# Patient Record
Sex: Female | Born: 1945 | Race: White | Hispanic: No | Smoking: Never smoker
Health system: Southern US, Community
[De-identification: ages and names within clinical notes are randomized; demographics above are authoritative.]

## PROBLEM LIST (undated history)

## (undated) DIAGNOSIS — E785 Hyperlipidemia, unspecified: Secondary | ICD-10-CM

## (undated) DIAGNOSIS — M8580 Other specified disorders of bone density and structure, unspecified site: Secondary | ICD-10-CM

## (undated) HISTORY — DX: Other specified disorders of bone density and structure, unspecified site: M85.80

## (undated) HISTORY — PX: BREAST EXCISIONAL BIOPSY: SUR124

## (undated) HISTORY — DX: Hyperlipidemia, unspecified: E78.5

## (undated) HISTORY — PX: BREAST SURGERY: SHX581

## (undated) HISTORY — PX: COLONOSCOPY: SHX174

## (undated) HISTORY — PX: FINGER SURGERY: SHX640

---

## 2008-05-12 ENCOUNTER — Encounter: Admission: RE | Admit: 2008-05-12 | Discharge: 2008-05-12 | Payer: Self-pay | Admitting: Internal Medicine

## 2009-06-05 ENCOUNTER — Encounter: Admission: RE | Admit: 2009-06-05 | Discharge: 2009-06-05 | Payer: Self-pay | Admitting: Internal Medicine

## 2009-07-11 ENCOUNTER — Encounter (INDEPENDENT_AMBULATORY_CARE_PROVIDER_SITE_OTHER): Payer: Self-pay | Admitting: Cardiology

## 2009-07-11 ENCOUNTER — Ambulatory Visit (HOSPITAL_COMMUNITY): Admission: RE | Admit: 2009-07-11 | Discharge: 2009-07-11 | Payer: Self-pay | Admitting: Cardiology

## 2010-06-05 ENCOUNTER — Other Ambulatory Visit: Payer: Self-pay | Admitting: Internal Medicine

## 2010-06-05 DIAGNOSIS — Z1231 Encounter for screening mammogram for malignant neoplasm of breast: Secondary | ICD-10-CM

## 2010-06-12 ENCOUNTER — Ambulatory Visit
Admission: RE | Admit: 2010-06-12 | Discharge: 2010-06-12 | Disposition: A | Discharge status: Home / Self Care | Payer: PRIVATE HEALTH INSURANCE | Source: Ambulatory Visit | Attending: Internal Medicine | Admitting: Internal Medicine

## 2010-06-12 DIAGNOSIS — Z1231 Encounter for screening mammogram for malignant neoplasm of breast: Secondary | ICD-10-CM

## 2011-08-26 ENCOUNTER — Other Ambulatory Visit: Payer: Self-pay | Admitting: Internal Medicine

## 2011-08-26 DIAGNOSIS — Z1231 Encounter for screening mammogram for malignant neoplasm of breast: Secondary | ICD-10-CM

## 2011-09-17 ENCOUNTER — Ambulatory Visit
Admission: RE | Admit: 2011-09-17 | Discharge: 2011-09-17 | Disposition: A | Discharge status: Home / Self Care | Payer: PRIVATE HEALTH INSURANCE | Source: Ambulatory Visit | Attending: Internal Medicine | Admitting: Internal Medicine

## 2011-09-17 DIAGNOSIS — Z1231 Encounter for screening mammogram for malignant neoplasm of breast: Secondary | ICD-10-CM

## 2012-10-05 ENCOUNTER — Other Ambulatory Visit: Payer: Self-pay

## 2012-10-05 DIAGNOSIS — Z1231 Encounter for screening mammogram for malignant neoplasm of breast: Secondary | ICD-10-CM

## 2012-11-10 ENCOUNTER — Ambulatory Visit
Admission: RE | Admit: 2012-11-10 | Discharge: 2012-11-10 | Disposition: A | Discharge status: Home / Self Care | Payer: PRIVATE HEALTH INSURANCE | Source: Ambulatory Visit

## 2012-11-10 DIAGNOSIS — Z1231 Encounter for screening mammogram for malignant neoplasm of breast: Secondary | ICD-10-CM

## 2013-11-20 ENCOUNTER — Inpatient Hospital Stay (HOSPITAL_COMMUNITY)
Admission: EM | Admit: 2013-11-20 | Discharge: 2013-11-23 | DRG: 536 | Disposition: A | Discharge status: Skilled Nursing Facility | Payer: Medicare Other | Attending: Orthopaedic Surgery | Admitting: Orthopaedic Surgery

## 2013-11-20 ENCOUNTER — Emergency Department (HOSPITAL_COMMUNITY): Payer: Medicare Other

## 2013-11-20 ENCOUNTER — Encounter (HOSPITAL_COMMUNITY): Payer: Self-pay | Admitting: Emergency Medicine

## 2013-11-20 DIAGNOSIS — S32301A Unspecified fracture of right ilium, initial encounter for closed fracture: Secondary | ICD-10-CM

## 2013-11-20 DIAGNOSIS — W64XXXA Exposure to other animate mechanical forces, initial encounter: Secondary | ICD-10-CM | POA: Diagnosis present

## 2013-11-20 DIAGNOSIS — M25559 Pain in unspecified hip: Secondary | ICD-10-CM | POA: Diagnosis present

## 2013-11-20 DIAGNOSIS — S32309A Unspecified fracture of unspecified ilium, initial encounter for closed fracture: Secondary | ICD-10-CM | POA: Diagnosis present

## 2013-11-20 LAB — CBC WITH DIFFERENTIAL/PLATELET
BASOS PCT: 0 % (ref 0–1)
Basophils Absolute: 0 10*3/uL (ref 0.0–0.1)
EOS PCT: 0 % (ref 0–5)
Eosinophils Absolute: 0 10*3/uL (ref 0.0–0.7)
HEMATOCRIT: 38.5 % (ref 36.0–46.0)
Hemoglobin: 13 g/dL (ref 12.0–15.0)
LYMPHS PCT: 8 % — AB (ref 12–46)
Lymphs Abs: 1.2 10*3/uL (ref 0.7–4.0)
MCH: 30.8 pg (ref 26.0–34.0)
MCHC: 33.8 g/dL (ref 30.0–36.0)
MCV: 91.2 fL (ref 78.0–100.0)
MONO ABS: 1 10*3/uL (ref 0.1–1.0)
MONOS PCT: 7 % (ref 3–12)
Neutro Abs: 12.6 10*3/uL — ABNORMAL HIGH (ref 1.7–7.7)
Neutrophils Relative %: 85 % — ABNORMAL HIGH (ref 43–77)
Platelets: 305 10*3/uL (ref 150–400)
RBC: 4.22 MIL/uL (ref 3.87–5.11)
RDW: 13.5 % (ref 11.5–15.5)
WBC: 14.9 10*3/uL — ABNORMAL HIGH (ref 4.0–10.5)

## 2013-11-20 LAB — ABO/RH: ABO/RH(D): A NEG

## 2013-11-20 LAB — BASIC METABOLIC PANEL
Anion gap: 16 — ABNORMAL HIGH (ref 5–15)
BUN: 21 mg/dL (ref 6–23)
CO2: 23 mEq/L (ref 19–32)
Calcium: 9.3 mg/dL (ref 8.4–10.5)
Chloride: 101 mEq/L (ref 96–112)
Creatinine, Ser: 0.77 mg/dL (ref 0.50–1.10)
GFR calc Af Amer: >90 mL/min (ref >90)
GFR calc non Af Amer: 86 mL/min — ABNORMAL LOW (ref >90)
Glucose, Bld: 141 mg/dL — ABNORMAL HIGH (ref 70–99)
Potassium: 3.5 mEq/L — ABNORMAL LOW (ref 3.7–5.3)
Sodium: 140 mEq/L (ref 137–147)

## 2013-11-20 LAB — TYPE AND SCREEN
ABO/RH(D): A NEG
Antibody Screen: NEGATIVE

## 2013-11-20 LAB — PROTIME-INR
INR: 0.96 (ref 0.00–1.49)
Prothrombin Time: 12.8 seconds (ref 11.6–15.2)

## 2013-11-20 MED ORDER — ADULT MULTIVITAMIN W/MINERALS CH
1.0000 | ORAL_TABLET | Freq: Every day | ORAL | Status: DC
  Administered 2013-11-21 – 2013-11-23 (×3): 1 via ORAL
  Filled 2013-11-20 (×3): qty 1

## 2013-11-20 MED ORDER — ONDANSETRON HCL 4 MG PO TABS: 4.0000 mg | ORAL_TABLET | Freq: Four times a day (QID) | ORAL | Status: DC | PRN

## 2013-11-20 MED ORDER — ONDANSETRON HCL 4 MG/2ML IJ SOLN: 4.0000 mg | Freq: Three times a day (TID) | INTRAMUSCULAR | Status: AC | PRN

## 2013-11-20 MED ORDER — DIPHENHYDRAMINE HCL 12.5 MG/5ML PO ELIX: 12.5000 mg | ORAL_SOLUTION | ORAL | Status: DC | PRN

## 2013-11-20 MED ORDER — METOCLOPRAMIDE HCL 5 MG/ML IJ SOLN: 5.0000 mg | Freq: Three times a day (TID) | INTRAMUSCULAR | Status: DC | PRN

## 2013-11-20 MED ORDER — ONDANSETRON HCL 4 MG/2ML IJ SOLN
4.0000 mg | Freq: Four times a day (QID) | INTRAMUSCULAR | Status: DC | PRN
  Administered 2013-11-22: 4 mg via INTRAVENOUS
  Filled 2013-11-20: qty 2

## 2013-11-20 MED ORDER — METOCLOPRAMIDE HCL 10 MG PO TABS: 5.0000 mg | ORAL_TABLET | Freq: Three times a day (TID) | ORAL | Status: DC | PRN

## 2013-11-20 MED ORDER — OXYCODONE HCL 5 MG PO TABS
5.0000 mg | ORAL_TABLET | ORAL | Status: DC | PRN
  Administered 2013-11-21: 5 mg via ORAL
  Filled 2013-11-20: qty 1

## 2013-11-20 MED ORDER — FENTANYL CITRATE 0.05 MG/ML IJ SOLN
50.0000 ug | Freq: Once | INTRAMUSCULAR | Status: AC
  Administered 2013-11-20: 50 ug via INTRAVENOUS
  Filled 2013-11-20: qty 2

## 2013-11-20 MED ORDER — IOHEXOL 300 MG/ML  SOLN
100.0000 mL | Freq: Once | INTRAMUSCULAR | Status: AC | PRN
  Administered 2013-11-20: 100 mL via INTRAVENOUS

## 2013-11-20 MED ORDER — MORPHINE SULFATE 2 MG/ML IJ SOLN: 1.0000 mg | INTRAMUSCULAR | Status: DC | PRN

## 2013-11-20 MED ORDER — DOCUSATE SODIUM 100 MG PO CAPS
100.0000 mg | ORAL_CAPSULE | Freq: Two times a day (BID) | ORAL | Status: DC
  Administered 2013-11-20 – 2013-11-23 (×6): 100 mg via ORAL

## 2013-11-20 MED ORDER — HYDROCODONE-ACETAMINOPHEN 5-325 MG PO TABS
1.0000 | ORAL_TABLET | ORAL | Status: DC | PRN
  Administered 2013-11-20 – 2013-11-21 (×3): 1 via ORAL
  Administered 2013-11-21 – 2013-11-22 (×2): 2 via ORAL
  Administered 2013-11-22 – 2013-11-23 (×2): 1 via ORAL
  Filled 2013-11-20 (×2): qty 1
  Filled 2013-11-20: qty 2
  Filled 2013-11-20 (×2): qty 1
  Filled 2013-11-20: qty 2
  Filled 2013-11-20: qty 1

## 2013-11-20 MED ORDER — FENTANYL CITRATE 0.05 MG/ML IJ SOLN
100.0000 ug | Freq: Once | INTRAMUSCULAR | Status: AC
  Administered 2013-11-20: 100 ug via INTRAVENOUS
  Filled 2013-11-20: qty 2

## 2013-11-20 MED ORDER — ONDANSETRON HCL 4 MG/2ML IJ SOLN
4.0000 mg | Freq: Once | INTRAMUSCULAR | Status: AC
  Administered 2013-11-20: 4 mg via INTRAVENOUS
  Filled 2013-11-20: qty 2

## 2013-11-20 MED ORDER — SODIUM CHLORIDE 0.9 % IV SOLN: INTRAVENOUS | Status: DC

## 2013-11-20 MED ORDER — CALCIUM CARBONATE-VITAMIN D 500-200 MG-UNIT PO TABS
1.0000 | ORAL_TABLET | Freq: Every day | ORAL | Status: DC
  Administered 2013-11-21 – 2013-11-23 (×3): 1 via ORAL
  Filled 2013-11-20 (×4): qty 1

## 2013-11-20 MED ORDER — METHOCARBAMOL 500 MG PO TABS
500.0000 mg | ORAL_TABLET | Freq: Four times a day (QID) | ORAL | Status: DC | PRN
Start: 2013-11-20 — End: 2013-11-23
  Administered 2013-11-20 – 2013-11-23 (×3): 500 mg via ORAL
  Filled 2013-11-20 (×3): qty 1

## 2013-11-20 MED ORDER — MORPHINE SULFATE 4 MG/ML IJ SOLN: 4.0000 mg | Freq: Once | INTRAMUSCULAR | Status: DC

## 2013-11-20 MED ORDER — LUTEIN 10 MG PO TABS: 1.0000 | ORAL_TABLET | Freq: Every day | ORAL | Status: DC

## 2013-11-20 MED ORDER — DEXTROSE 5 % IV SOLN
500.0000 mg | Freq: Four times a day (QID) | INTRAVENOUS | Status: DC | PRN
  Filled 2013-11-20: qty 5

## 2013-11-20 NOTE — ED Notes (Signed)
Pt cleared off backboard, pt had no soreness or pain to palpation of spine and neck

## 2013-11-20 NOTE — ED Notes (Signed)
Bed: WA08 Expected date:  Expected time:  Means of arrival:  Comments: Fall hip pain

## 2013-11-20 NOTE — H&P (Signed)
[name] French is an 68 y.o. female.   Chief Complaint:   Right hip/pelvic pain; known iliac wing fracture HPI:   Very pleasant 68 yo female who was kicked in her right hip/pelvic area this afternoon by a horse.  Was brought to the [facility] ED and found to have a right pelvic iliac wing fracture.  Ortho was consulted for further evaluation and treatment.  She reports right-sided pelviv pain and denies other injuries.  History reviewed. No pertinent past medical history.  Past Surgical History  Procedure Laterality Date  . Finger surgery Right     ring  . Breast surgery Left     Ca++ deposits removed    No family history on file. Social History:  reports that she has never smoked. She does not have any smokeless tobacco history on file. She reports that she drinks alcohol. She reports that she does not use illicit drugs.  Allergies: No Known Allergies   (Not in a hospital admission)  Results for orders placed during the hospital encounter of 11/20/13 (from the past 48 hour(s))  BASIC METABOLIC PANEL     Status: Abnormal   Collection Time    11/20/13  3:30 PM      Result Value Ref Range   Sodium 140  137 - 147 mEq/L   Potassium 3.5 (*) 3.7 - 5.3 mEq/L   Chloride 101  96 - 112 mEq/L   CO2 23  19 - 32 mEq/L   Glucose, Bld 141 (*) 70 - 99 mg/dL   BUN 21  6 - 23 mg/dL   Creatinine, Ser 0.77  0.50 - 1.10 mg/dL   Calcium 9.3  8.4 - 10.5 mg/dL   GFR calc non Af Amer 86 (*) >90 mL/min   GFR calc Af Amer >90  >90 mL/min   Comment: (NOTE)     The eGFR has been calculated using the CKD EPI equation.     This calculation has not been validated in all clinical situations.     eGFR's persistently <90 mL/min signify possible Chronic Kidney     Disease.   Anion gap 16 (*) 5 - 15  CBC WITH DIFFERENTIAL     Status: Abnormal   Collection Time    11/20/13  3:30 PM      Result Value Ref Range   WBC 14.9 (*) 4.0 - 10.5 K/uL   RBC 4.22  3.87 - 5.11 MIL/uL   Hemoglobin 13.0  12.0 - 15.0  g/dL   HCT 38.5  36.0 - 46.0 %   MCV 91.2  78.0 - 100.0 fL   MCH 30.8  26.0 - 34.0 pg   MCHC 33.8  30.0 - 36.0 g/dL   RDW 13.5  11.5 - 15.5 %   Platelets 305  150 - 400 K/uL   Neutrophils Relative % 85 (*) 43 - 77 %   Neutro Abs 12.6 (*) 1.7 - 7.7 K/uL   Lymphocytes Relative 8 (*) 12 - 46 %   Lymphs Abs 1.2  0.7 - 4.0 K/uL   Monocytes Relative 7  3 - 12 %   Monocytes Absolute 1.0  0.1 - 1.0 K/uL   Eosinophils Relative 0  0 - 5 %   Eosinophils Absolute 0.0  0.0 - 0.7 K/uL   Basophils Relative 0  0 - 1 %   Basophils Absolute 0.0  0.0 - 0.1 K/uL  PROTIME-INR     Status: None   Collection Time    11/20/13  3:30  PM      Result Value Ref Range   Prothrombin Time 12.8  11.6 - 15.2 seconds   INR 0.96  0.00 - 1.49  TYPE AND SCREEN     Status: None   Collection Time    11/20/13  4:07 PM      Result Value Ref Range   ABO/RH(D) A NEG     Antibody Screen NEG     Sample Expiration 11/23/2013     Dg Hip Complete Right  11/20/2013   CLINICAL DATA:  Right hip pain, kicked by horse  EXAM: RIGHT HIP - COMPLETE 2+ VIEW  COMPARISON:  None.  FINDINGS: Acute displaced fracture through the right iliac wing. There is extensive surrounding hematoma and soft tissue swelling involving the gluteal musculature and overlying adipose tissues. The right sacroiliac joint appears intact. The femoral head remains located.  IMPRESSION: Acute displaced fracture through the right iliac wing extending from the anterior superior iliac spine through the iliac crest.  There is surrounding soft tissue swelling and hematoma.   Electronically Signed   By: Jacqulynn Cadet M.D.   On: 11/20/2013 14:35   Ct Abdomen Pelvis W Contrast  11/20/2013   CLINICAL DATA:  trauma protocol  EXAM: CT ABDOMEN AND PELVIS WITH CONTRAST  TECHNIQUE: Multidetector CT imaging of the abdomen and pelvis was performed using the standard protocol following bolus administration of intravenous contrast.  CONTRAST:  125m OMNIPAQUE IOHEXOL 300 MG/ML  SOLN   COMPARISON:  None.  FINDINGS: Minimal dependent atelectasis in the visualized lung bases. Unremarkable liver, nondistended gallbladder, spleen, adrenal glands, pancreas. Left parapelvic renal cyst. Renal parenchyma otherwise unremarkable bilaterally. No hydronephrosis. Minimal infrarenal aortic plaque. IVC and portal vein patent. Stomach, small bowel, and colon are nondilated. Urinary bladder is incompletely distended. Bilateral pelvic phleboliths.  There is a displaced fracture of the right iliac wing, major fracture fragments displaced approximately 15 mm. This does not extend to involve the acetabulum or SI joint. Hips are intact. There is a right pelvic extraperitoneal hematoma adjacent to the iliacus and right gluteal musculature. No other acute bone abnormality. Mild spondylitic changes in the lower lumbar spine.  IMPRESSION: 1. Displaced right iliac wing fracture with adjacent  hematoma. 2. No acute intra-abdominal process.   Electronically Signed   By: DArne ClevelandM.D.   On: 11/20/2013 17:15    Review of Systems  All other systems reviewed and are negative.   Blood pressure 146/59, pulse 79, temperature 97.8 F (36.6 C), temperature source Oral, resp. rate 16, SpO2 100.00%. Physical Exam  Constitutional: She is oriented to person, place, and time. She appears well-developed and well-nourished.  HENT:  Head: Normocephalic and atraumatic.  Eyes: EOM are normal. Pupils are equal, round, and reactive to light.  Neck: Normal range of motion. Neck supple.  Cardiovascular: Regular rhythm.   Respiratory: Effort normal and breath sounds normal.  GI: Soft. Bowel sounds are normal.  Musculoskeletal:       Right hip: She exhibits decreased range of motion, decreased strength and bony tenderness.       Legs: Neurological: She is alert and oriented to person, place, and time.  Skin: Skin is warm and dry.  Psychiatric: She has a normal mood and affect.     Assessment/Plan Right comminuted  pelvic iliac wing fracture 1)  I spoke with her in great length and she understands, given her pain and the effects of this fracture on her mobility, that she needs admission to the hospital for pain  control and therapy.  I will allow her to eat and she can be full WBAT on her right hip.  She will need PT and OT given the fact that she lives alone.  Olajuwon Fosdick Y 11/20/2013, 7:03 PM

## 2013-11-20 NOTE — ED Provider Notes (Signed)
Medical screening examination/treatment/procedure(s) were performed by non-physician practitioner and as supervising physician I was immediately available for consultation/collaboration.   EKG Interpretation None        Earnie Rockhold L Kolleen Ochsner, MD 11/20/13 2223 

## 2013-11-20 NOTE — ED Notes (Signed)
Called to give nurse report. Nurse unavailable

## 2013-11-20 NOTE — ED Provider Notes (Signed)
CSN: [id number]     Arrival date & time 11/20/13  1327 History   First MD Initiated Contact with Patient 11/20/13 1511     Chief Complaint  Patient presents with  . Hip Pain  . Fall     (Consider location/radiation/quality/duration/timing/severity/associated sxs/prior Treatment) HPI  Patient to the ER by EMS after being kicked by a large horse. The horse had meant to kick the horse she was walking next to but that horse moved out of the way. She immediately fell to the ground and has been unable to walk since then. She has no head or neck pain. No chest pain or abdominal pain. Denies loc or hitting her head. Her pain is localized to her right hip. Denies bowel or urine incontinence. Denies being unable to move her toes. She was given 100 mcg of Fentanyl by EMS and then another 50 mcg here in the ED. She denies needing anymore pain medication at this time and says that she as little to no pain when sitting still. Brought in on LSB and in c-collar.  History reviewed. No pertinent past medical history. Past Surgical History  Procedure Laterality Date  . Finger surgery Right     ring  . Breast surgery Left     Ca++ deposits removed   No family history on file. History  Substance Use Topics  . Smoking status: Never Smoker   . Smokeless tobacco: Not on file  . Alcohol Use: Yes     Comment: social   OB History   Grav Para Term Preterm Abortions TAB SAB Ect Mult Living                 Review of Systems   Review of Systems  Gen: no weight loss, fevers, chills, night sweats  Eyes: no occular draining, occular pain,  No visual changes  Nose: no epistaxis or rhinorrhea  Mouth: no dental pain, no sore throat  Neck: no neck pain  Lungs: No hemoptysis. No wheezing or coughing CV:  No palpitations, dependent edema or orthopnea. No chest pain Abd: no diarrhea. No nausea or vomiting, No abdominal pain  GU: no dysuria or gross hematuria  MSK:  + right hip injury Neuro: no headache, no  focal neurologic deficits  Skin: no rash , no wounds Psyche: no complaints of depression or anxiety    Allergies  Review of patient's allergies indicates no known allergies.  Home Medications   Prior to Admission medications   Medication Sig Start Date End Date Taking? Authorizing Provider  calcium-vitamin D (OSCAL WITH D) 500-200 MG-UNIT per tablet Take 1 tablet by mouth daily with breakfast.   Yes Historical Provider, MD  Cholecalciferol (VITAMIN D PO) Take 1 tablet by mouth daily.   Yes Historical Provider, MD  Lutein 10 MG TABS Take 1 tablet by mouth daily.   Yes Historical Provider, MD  Multiple Vitamin (MULTIVITAMIN WITH MINERALS) TABS tablet Take 1 tablet by mouth daily.   Yes Historical Provider, MD  Omega-3 Fatty Acids (FISH OIL PO) Take 1 tablet by mouth daily.   Yes Historical Provider, MD  pneumococcal 13-valent conjugate vaccine (PREVNAR 13) SUSP injection Inject 0.5 mLs into the muscle once.   Yes Historical Provider, MD   BP 146/59  Pulse 79  Temp(Src) 97.8 F (36.6 C) (Oral)  Resp 16  SpO2 100% Physical Exam  Nursing note and vitals reviewed. Constitutional: She appears well-developed and well-nourished. No distress.  HENT:  Head: Normocephalic and atraumatic. Head is without  raccoon's eyes, without Battle's sign, without abrasion, without contusion, without laceration, without right periorbital erythema and without left periorbital erythema.  Right Ear: Tympanic membrane and ear canal normal.  Left Ear: Tympanic membrane and ear canal normal.  Nose: Nose normal.  Eyes: Pupils are equal, round, and reactive to light.  Neck: Normal range of motion. Neck supple.  Cardiovascular: Normal rate and regular rhythm.   Pulmonary/Chest: Effort normal. No respiratory distress. She has no wheezes. She exhibits no tenderness, no laceration, no deformity and no swelling.  Abdominal: Soft. There is no tenderness. There is no rebound.  No ecchymosis, distention, fluid level or  skin injuries.  Musculoskeletal:       Right hip: She exhibits decreased range of motion, decreased strength, tenderness, bony tenderness and crepitus. She exhibits no swelling, no deformity and no laceration.  Pt has decreased strength in right leg due to pain. She has FROM of all 10 toes with CR < 3 second in all toes on the right with pedal pulses palpable, intact sensation. NO deformity noted.  Neurological: She is alert.  Skin: Skin is warm and dry.    ED Course  Procedures (including critical care time) Labs Review Labs Reviewed  BASIC METABOLIC PANEL - Abnormal; Notable for the following:    Potassium 3.5 (*)    Glucose, Bld 141 (*)    GFR calc non Af Amer 86 (*)    Anion gap 16 (*)    All other components within normal limits  CBC WITH DIFFERENTIAL - Abnormal; Notable for the following:    WBC 14.9 (*)    Neutrophils Relative % 85 (*)    Neutro Abs 12.6 (*)    Lymphocytes Relative 8 (*)    All other components within normal limits  PROTIME-INR  TYPE AND SCREEN  ABO/RH    Imaging Review Dg Hip Complete Right  11/20/2013   CLINICAL DATA:  Right hip pain, kicked by horse  EXAM: RIGHT HIP - COMPLETE 2+ VIEW  COMPARISON:  None.  FINDINGS: Acute displaced fracture through the right iliac wing. There is extensive surrounding hematoma and soft tissue swelling involving the gluteal musculature and overlying adipose tissues. The right sacroiliac joint appears intact. The femoral head remains located.  IMPRESSION: Acute displaced fracture through the right iliac wing extending from the anterior superior iliac spine through the iliac crest.  There is surrounding soft tissue swelling and hematoma.   Electronically Signed   By: Malachy Moan M.D.   On: 11/20/2013 14:35   Ct Abdomen Pelvis W Contrast  11/20/2013   CLINICAL DATA:  trauma protocol  EXAM: CT ABDOMEN AND PELVIS WITH CONTRAST  TECHNIQUE: Multidetector CT imaging of the abdomen and pelvis was performed using the standard  protocol following bolus administration of intravenous contrast.  CONTRAST:  OMNIPAQUE IOHEXOL 300 MG/ML  SOLN  COMPARISON:  None.  FINDINGS: Minimal dependent atelectasis in the visualized lung bases. Unremarkable liver, nondistended gallbladder, spleen, adrenal glands, pancreas. Left parapelvic renal cyst. Renal parenchyma otherwise unremarkable bilaterally. No hydronephrosis. Minimal infrarenal aortic plaque. IVC and portal vein patent. Stomach, small bowel, and colon are nondilated. Urinary bladder is incompletely distended. Bilateral pelvic phleboliths.  There is a displaced fracture of the right iliac wing, major fracture fragments displaced approximately 15 mm. This does not extend to involve the acetabulum or SI joint. Hips are intact. There is a right pelvic extraperitoneal hematoma adjacent to the iliacus and right gluteal musculature. No other acute bone abnormality. Mild spondylitic changes in  the lower lumbar spine.  IMPRESSION: 1. Displaced right iliac wing fracture with adjacent  hematoma. 2. No acute intra-abdominal process.   Electronically Signed   By: Oley Balm M.D.   On: 11/20/2013 17:15     EKG Interpretation None      MDM   Final diagnoses:  None   4:00pm Dr. Estell Harpin has been highly involved in patient care. He has consulted with Surgery and Orthopedics.  He has ordered CT abd/pelvis with contrast. Patient updated on xray findings and tentative plans. Her pain is under control and her vital signs remain stable.  5:12 pm The orthopedist says if the patients pain can be tolerated and if she is able to bear weight than she can go home with pain medication and f/u with ortho in 2 weeks. So long as no other findings on CT scan.  6:15 pm The patient CT scan shows no other abnormalities. The patient is unable to move the leg at all and is unable to tolerate bearing weight. Admitting to obs, Dr. Magnus Ivan (ortho), can have diet, med-surg floor.  Filed Vitals:   11/20/13  1728  BP: 146/59  Pulse: 79  Temp:   Resp:      Dorthula Matas, PA-C 11/20/13 1817

## 2013-11-20 NOTE — ED Notes (Signed)
Pt sts that it is too painful to try to move R leg/hip to ambulate. Tiffany, PA  notifed

## 2013-11-20 NOTE — ED Notes (Signed)
Pt in [place] EMS [place]-Per EMS, pt was walking and her horse kicked her in the R hip. Pt was knocked to the ground. Pt denies head injury or LOC. Pt R hip pain 2/10. Pt is A&O and in NAD.Pt denies neck or back pain. Pt does not take blood thinners. Pt does not have obvious deformity, shortening or rotation. Pt received 100 mcg Fentanyl en route

## 2013-11-20 NOTE — ED Notes (Signed)
Pt sts that she went out to get her horse from the pasture. Another horse tried to chase her horse away and kicked pt in the R hip. Pt denies head injury or LOC. Pt is A&O and in NAD. Pt has large hematoma to R hip with a small abrasion.

## 2013-11-21 ENCOUNTER — Encounter (HOSPITAL_COMMUNITY): Payer: Self-pay | Admitting: *Deleted

## 2013-11-21 NOTE — Evaluation (Signed)
Occupational Therapy Evaluation Patient Details Name: [name] French MRN: [id number] DOB: 1946-01-23 Today's Date: 11/21/2013    History of Present Illness 68 yo female admitted 8/30 after  being kicked by a horse, sustained R iliac wing fz, hematoma.   Clinical Impression   Pt presents to OT with decreased I with ADL activity due to problems listed below after being kicked by a horse. Pt has a multi level home and decreased A at home. Pt would benefit from skilled OT as well as post acute rehab prior to going home to increase I with ADL activity     Follow Up Recommendations  CIR    Equipment Recommendations  3 in 1 bedside comode       Precautions / Restrictions Precautions Precaution Comments: r Thigh hematoma Restrictions RLE Weight Bearing: Weight bearing as tolerated      Mobility Bed Mobility Overal bed mobility: Needs Assistance Bed Mobility: Supine to Sit     Supine to sit: HOB elevated     General bed mobility comments: pt up in chair  Transfers Overall transfer level: Needs assistance Equipment used: Rolling walker (2 wheeled) Transfers: Sit to/from Stand Sit to Stand: Mod assist         General transfer comment: cues for safety with RW, hand placement         ADL Overall ADL's : Needs assistance/impaired     Grooming: Standing;Minimal assistance   Upper Body Bathing: Minimal assitance;Standing   Lower Body Bathing: Sit to/from stand;Maximal assistance   Upper Body Dressing : Minimal assistance;Standing       Toilet Transfer: Moderate assistance;Regular Toilet;Grab bars;RW Statistician Details (indicate cue type and reason): increased time and A with stand to sit than sit to stand   Toileting - Clothing Manipulation Details (indicate cue type and reason): pt has a hard time taking hands off walker to perform at this time                       Pertinent Vitals/Pain Pain Assessment: 0-10 Pain Score: 4  Pain Descriptors /  Indicators: Aching Pain Intervention(s): Monitored during session        Extremity/Trunk Assessment Upper Extremity Assessment Upper Extremity Assessment: Overall WFL for tasks assessed   Lower Extremity Assessment Lower Extremity Assessment: RLE deficits/detail RLE Deficits / Details: required  suooirt to move leg to edge of the bed, and support   for foot  down to the floor.,   Cervical / Trunk Assessment Cervical / Trunk Assessment: Normal   Communication Communication Communication: No difficulties   Cognition Arousal/Alertness: Awake/alert Behavior During Therapy: WFL for tasks assessed/performed Overall Cognitive Status: Within Functional Limits for tasks assessed                     General Comments    Pt has supportive friends, but needs to increase I prior to returning home           Home Living Family/patient expects to be discharged to:: Private residence Living Arrangements: Alone     Home Access: Stairs to enter Entrance Stairs-Number of Steps: 3= 1 Entrance Stairs-Rails: Right;Left Home Layout: Two level;1/2 bath on main level;Bed/bath upstairs Alternate Level Stairs-Number of Steps: 13 Alternate Level Stairs-Rails: Left     Bathroom Toilet: Standard     Home Equipment: None          Prior Functioning/Environment Level of Independence: Independent  OT Diagnosis: Generalized weakness;Acute pain   OT Problem List: Decreased strength;Pain   OT Treatment/Interventions: Self-care/ADL training;DME and/or AE instruction;Patient/family education    OT Goals(Current goals can be found in the care plan section) Acute Rehab OT Goals Patient Stated Goal: to go home eventually. OT Goal Formulation: With patient Time For Goal Achievement: 12/28/13  OT Frequency: Min 2X/week    End of Session Equipment Utilized During Treatment: Rolling walker Nurse Communication: Mobility status  Activity Tolerance: Patient tolerated  treatment well Patient left: in chair   Time: 8295-6213 OT Time Calculation (min): 29 min Charges:  OT General Charges $OT Visit: 1 Procedure OT Evaluation $Initial OT Evaluation Tier I: 1 Procedure OT Treatments $Self Care/Home Management : 23-37 mins G-Codes:    Einar Crow D Dec 07, 2013, 12:45 PM

## 2013-11-21 NOTE — Progress Notes (Signed)
Clinical Social Work Department BRIEF PSYCHOSOCIAL ASSESSMENT 11/21/2013  Patient:  Quillin,Laira     Account Number:  [id number]     Admit date:  11/20/2013  Clinical Social Worker:  Lacie Scotts  Date/Time:  11/21/2013 04:45 PM  Referred by:  CSW  Date Referred:  11/21/2013 Referred for  SNF Placement   Other Referral:   Interview type:  Patient Other interview type:    PSYCHOSOCIAL DATA Living Status:  ALONE Admitted from facility:   Level of care:   Primary support name:  Ponciano Ort Primary support relationship to patient:  SIBLING Degree of support available:   supportive    CURRENT CONCERNS Current Concerns  Post-Acute Placement   Other Concerns:    SOCIAL WORK ASSESSMENT / PLAN Pt is a 68 yr old female admitted to hospital after being kicked by a horse. Pt has a pelvic fx. Surgery is not required. CSW has met with pt / family to assist with d/c planning.  PT has recommended [facility] . Pt will consider this option. SNF search has been initiated and bed offers are pending.   Assessment/plan status:  Psychosocial Support/Ongoing Assessment of Needs Other assessment/ plan:   Information/referral to community resources:   Insurance coverage for SNF and ambulance transport reviewed.    PATIENT'S/FAMILY'S RESPONSE TO PLAN OF CARE: Pt is sore due to fx. She is hospitalized for pain management. Pt is considering d/c options, SNF vs Home. CSW will continue to follow until d/c plan is finalized. RNCM has been updated and will assist pt if she chooses to return home.   Werner Lean LCSW [phone]

## 2013-11-21 NOTE — Progress Notes (Signed)
CARE MANAGEMENT NOTE 11/21/2013  Patient:  French,[name]   Account Number:  [id number]  Date Initiated:  11/21/2013  Documentation initiated by:  DAVIS,RHONDA  Subjective/Objective Assessment:   68 yo female who was kicked in her right hip/pelvic area by a horse.  right pelvic iliac wing fracture. Acute displaced fracture through the right iliac wing extending from the anterior superior iliac spine through the iliac crest. There     Action/Plan:   home when stable   Anticipated DC Date:  11/24/2013   Anticipated DC Plan:  HOME/SELF CARE  In-house referral  NA  NA      DC Planning Services  CM consult      PAC Choice  NA   Choice offered to / List presented to:  NA      DME agency  NA     HH arranged  NA      HH agency  NA  NA   Status of service:  In process, will continue to follow Medicare Important Message given?  NA - LOS <3 / Initial given by admissions (If response is "NO", the following Medicare IM given date fields will be blank) Date Medicare IM given:   Medicare IM given by:   Date Additional Medicare IM given:   Additional Medicare IM given by:    Discharge Disposition:    Per UR Regulation:  Reviewed for med. necessity/level of care/duration of stay  If discussed at Long Length of Stay Meetings, dates discussed:    Comments:  Bjorn Loser Davis,RN,BSN,CCM

## 2013-11-21 NOTE — Progress Notes (Signed)
Occupational Therapy Treatment Patient Details Name: [name] French MRN: [id number] DOB: [birth year] Today's Date: 11/21/2013    History of present illness 68 yo female admitted 8/30 after  being kicked by a horse, sustained R iliac wing fz, hematoma.      Follow Up Recommendations  SNF    Equipment Recommendations  3 in 1 bedside comode       Precautions / Restrictions Precautions Precaution Comments: r Thigh hematoma Restrictions RLE Weight Bearing: Weight bearing as tolerated       Mobility Bed Mobility   Bed Mobility: Sit to Supine;Supine to Sit     Supine to sit: HOB elevated;Mod assist Sit to supine: Mod assist;HOB elevated   General bed mobility comments: assist with L leg onto bed, extra time to wor through pain  Transfers Overall transfer level: Needs assistance Equipment used: Rolling walker (2 wheeled) Transfers: Sit to/from Stand Sit to Stand: Min assist         General transfer comment: cues for safety with RW, hand placement        ADL Overall ADL's : Needs assistance/impaired     Grooming: Standing;Minimal assistance   Upper Body Bathing: Minimal assitance;Standing   Lower Body Bathing: Sit to/from stand;Maximal assistance   Upper Body Dressing : Minimal assistance;Standing       Toilet Transfer: Minimal assistance;Regular Toilet;RW;Ambulation Toilet Transfer Details (indicate cue type and reason): increased time and A with stand to sit than sit to stand Toileting- Clothing Manipulation and Hygiene: Minimal assistance;Sit to/from stand;Cueing for safety Toileting - Clothing Manipulation Details (indicate cue type and reason): pt has a hard time taking hands off walker to perform at this time     Functional mobility during ADLs: Minimal assistance General ADL Comments: pt dissapointed rehab not an option but open to SNF .  Pt moving much better this afternoon than earlier in the day.  VC needed for hand positioning                  Cognition   Behavior During Therapy: WFL for tasks assessed/performed Overall Cognitive Status: Within Functional Limits for tasks assessed                       Extremity/Trunk Assessment  Upper Extremity Assessment Upper Extremity Assessment: Overall WFL for tasks assessed                       Pertinent Vitals/ Pain       Pain Score: 4  Pain Location: r hip Pain Descriptors / Indicators: Throbbing;Sore;Tightness Pain Intervention(s): Monitored during session  Home Living Family/patient expects to be discharged to:: Private residence Living Arrangements: Alone     Home Access: Stairs to enter Secretary/administrator of Steps: 3= 1 Entrance Stairs-Rails: Right;Left Home Layout: Two level;1/2 bath on main level;Bed/bath upstairs Alternate Level Stairs-Number of Steps: 13 Alternate Level Stairs-Rails: Left     Bathroom Toilet: Standard     Home Equipment: None          Prior Functioning/Environment Level of Independence: Independent            Frequency Min 2X/week     Progress Toward Goals  OT Goals(current goals can now be found in the care plan section)  Progress towards OT goals: Progressing toward goals  Acute Rehab OT Goals Patient Stated Goal: to go home eventually. OT Goal Formulation: With patient Time For Goal Achievement: 12/28/13 ADL Goals Pt Will Perform Grooming: with  modified independence;standing Pt Will Perform Upper Body Dressing: with modified independence;sitting Pt Will Perform Lower Body Dressing: with modified independence;sit to/from stand Pt Will Transfer to Toilet: with modified independence;ambulating;regular height toilet Pt Will Perform Toileting - Clothing Manipulation and hygiene: with modified independence;sit to/from stand Pt Will Perform Tub/Shower Transfer: Shower transfer;shower seat  Plan Discharge plan needs to be updated    Co-evaluation                 End of Session Equipment  Utilized During Treatment: Rolling walker   Activity Tolerance Patient tolerated treatment well   Patient Left in bed;with call bell/phone within reach   Nurse Communication Mobility status        Time: 4696-2952 OT Time Calculation (min): 17 min  Charges: OT General Charges $OT Visit: 1 Procedure OT Evaluation $Initial OT Evaluation Tier I: 1 Procedure OT Treatments $Self Care/Home Management : 8-22 mins  Tara Rud D 11/21/2013, 4:10 PM

## 2013-11-21 NOTE — Progress Notes (Signed)
Thank you for consult on [name] French. Chart reviwed and note that patient admitted with pelvic ring fracture and with deficits in mobility. She does not have medical complexity to justify CIR and would recommend SNF for follow up therapies. Will defer CIR consult for now.

## 2013-11-21 NOTE — Progress Notes (Signed)
Physical Therapy Treatment Patient Details Name: [name] French MRN: [id number] DOB: [birth year] Today's Date: 11/21/2013    History of Present Illness 68 yo female admitted 8/30 after  being kicked by a horse, sustained R iliac wing fz, hematoma.    PT Comments    Reports pain in R thigh and hip. Care management working on options for DC. Recommend Hospital bed if available if DC to home. Post acute rehab is an appropriate  option ,  Follow Up Recommendations  SNF;Home health PT     Equipment Recommendations  Rolling walker with 5" wheels;3in1 (PT) (if DC to home, hospital bed.)    Recommendations for Other Services       Precautions / Restrictions Precautions Precaution Comments: r Thigh hematoma Restrictions RLE Weight Bearing: Weight bearing as tolerated    Mobility  Bed Mobility   Bed Mobility: Sit to Supine     Supine to sit: HOB elevated Sit to supine: Mod assist   General bed mobility comments: assist with L leg onto bed, extra time to wor through pain  Transfers Overall transfer level: Needs assistance Equipment used: Rolling walker (2 wheeled) Transfers: Sit to/from Stand Sit to Stand: Min assist         General transfer comment: cues for safety with RW, hand placement  Ambulation/Gait Ambulation/Gait assistance: Min assist Ambulation Distance (Feet): 20 Feet (x2) Assistive device: Rolling walker (2 wheeled) Gait Pattern/deviations: Step-to pattern;Antalgic;Decreased step length - right Gait velocity: decreased   General Gait Details: Pt is only TDWB on right during ambulation,  unable to advance R leg first due to pain   Stairs            Wheelchair Mobility    Modified Rankin (Stroke Patients Only)       Balance                                    Cognition Arousal/Alertness: Awake/alert Behavior During Therapy: WFL for tasks assessed/performed Overall Cognitive Status: Within Functional Limits for tasks  assessed                      Exercises      General Comments        Pertinent Vitals/Pain Pain Score: 5  Pain Location: R hip area Pain Descriptors / Indicators: Throbbing Pain Intervention(s): Monitored during session    Home Living Family/patient expects to be discharged to:: Private residence Living Arrangements: Alone     Home Access: Stairs to enter Entrance Stairs-Rails: Right;Left Home Layout: Two level;1/2 bath on main level;Bed/bath upstairs Home Equipment: None      Prior Function Level of Independence: Independent          PT Goals (current goals can now be found in the care plan section) Acute Rehab PT Goals Patient Stated Goal: to go home eventually. Progress towards PT goals: Progressing toward goals    Frequency  Min 6X/week    PT Plan Discharge plan needs to be updated    Co-evaluation             End of Session   Activity Tolerance: Patient limited by pain Patient left: in bed;with call bell/phone within reach;with family/visitor present     Time: 1422-1436 PT Time Calculation (min): 14 min  Charges:  $Gait Training: 8-22 mins  G Codes:      Rada Hay 11/21/2013, 3:08 PM Blanchard Kelch PT [phone]

## 2013-11-21 NOTE — Progress Notes (Signed)
Patient ID: [name] French, female   DOB: Oct 13, 1945, 68 y.o.   MRN: [id number] Has been up with PT, but difficulty due to pain from her fracture.  Therapy is recommending inpatient rehab given that she lives alone and has a lot of steps to climb in her house.  Will put in order for inpatient rehab consult.

## 2013-11-21 NOTE — Evaluation (Signed)
Physical Therapy Evaluation Patient Details Name: [name] French MRN: [id number] DOB: 12-15-1945 Today's Date: 11/21/2013   History of Present Illness  68 yo female admitted 8/30 after  being kicked by a horse, sustained R iliac wing fracture, hematoma.  Clinical Impression  Pt requiring mod assist for bed mobility and ambulation due to R hip. Pelvic pain. Pt has multiple steps  In home,  Will need to be on RW  Until able to bear weight on R leg. Pt will benefit from PT to address problems listed in note below. Will   Have PT this PM. Patient may not be able to tolerate attempts  At steps today. Patient lives alone, will have intermittent check- ons by friends only.     Follow Up Recommendations CIR;Supervision/Assistance - 24 hour    Equipment Recommendations  Rolling walker with 5" wheels    Recommendations for Other Services Rehab consult     Precautions / Restrictions Precautions Precaution Comments: r Thigh hematoma Restrictions RLE Weight Bearing: Weight bearing as tolerated      Mobility  Bed Mobility Overal bed mobility: Needs Assistance Bed Mobility: Supine to Sit     Supine to sit: Mod assist;HOB elevated     General bed mobility comments: HOBat 50* , assist for R leg to edge of the bed.  Transfers Overall transfer level: Needs assistance Equipment used: Rolling walker (2 wheeled) Transfers: Sit to/from Stand Sit to Stand: Mod assist         General transfer comment: cues for safety with RW, hand placement  Ambulation/Gait Ambulation/Gait assistance: Mod assist Ambulation Distance (Feet): 25 Feet Assistive device: Rolling walker (2 wheeled) Gait Pattern/deviations: Step-to pattern;Decreased step length - right;Decreased stance time - right Gait velocity: decreased   General Gait Details: Pt is only TDWB on right during ambulation,  unable to advance R leg first due to pain  Stairs            Wheelchair Mobility    Modified Rankin (Stroke  Patients Only)       Balance                                             Pertinent Vitals/Pain Pain Assessment: 0-10 Pain Score: 6  Pain Descriptors / Indicators: Cramping;Guarding;Tightness    Home Living Family/patient expects to be discharged to:: Private residence Living Arrangements: Alone     Home Access: Stairs to enter Entrance Stairs-Rails: Doctor, general practice of Steps: 3= 1 Home Layout: Two level;1/2 bath on main level;Bed/bath upstairs Home Equipment: None      Prior Function Level of Independence: Independent               Hand Dominance        Extremity/Trunk Assessment   Upper Extremity Assessment: Overall WFL for tasks assessed           Lower Extremity Assessment: RLE deficits/detail RLE Deficits / Details: required  suooirt to move leg to edge of the bed, and support   for foot  down to the floor.,    Cervical / Trunk Assessment: Normal  Communication   Communication: No difficulties  Cognition Arousal/Alertness: Awake/alert Behavior During Therapy: WFL for tasks assessed/performed Overall Cognitive Status: Within Functional Limits for tasks assessed                      General Comments  Exercises        Assessment/Plan    PT Assessment Patient needs continued PT services  PT Diagnosis Difficulty walking;Acute pain   PT Problem List Decreased strength;Decreased range of motion;Decreased activity tolerance;Decreased knowledge of use of DME;Decreased mobility;Decreased safety awareness;Pain  PT Treatment Interventions DME instruction;Gait training;Stair training;Functional mobility training;Therapeutic activities;Therapeutic exercise;Patient/family education   PT Goals (Current goals can be found in the Care Plan section) Acute Rehab PT Goals Patient Stated Goal: to go home eventually. PT Goal Formulation: With patient Time For Goal Achievement: 11/28/13 Potential to Achieve  Goals: Good    Frequency Min 6X/week   Barriers to discharge Inaccessible home environment;Decreased caregiver support      Co-evaluation               End of Session   Activity Tolerance: Patient limited by pain Patient left: in chair;with call bell/phone within reach;with family/visitor present Nurse Communication: Mobility status         Time: 1610-9604 PT Time Calculation (min): 29 min   Charges:   PT Evaluation $Initial PT Evaluation Tier I: 1 Procedure PT Treatments $Gait Training: 23-37 mins   PT G Codes:          Rada Hay 11/21/2013, 10:58 AM Blanchard Kelch PT [phone]

## 2013-11-21 NOTE — Progress Notes (Signed)
Clinical Social Work Department CLINICAL SOCIAL WORK PLACEMENT NOTE 11/21/2013  Patient:  [name] French,[name] French  Account Number:  [id number] Admit date:  11/20/2013  Clinical Social Worker:  Cori Razor, LCSW  Date/time:  11/21/2013 04:53 PM  Clinical Social Work is seeking post-discharge placement for this patient at the following level of care:   SKILLED NURSING   (*CSW will update this form in Epic as items are completed)     Patient/family provided with [facility] Department of Clinical Social Work's list of facilities offering this level of care within the geographic area requested by the patient (or if unable, by the patient's family).  11/21/2013  Patient/family informed of their freedom to choose among providers that offer the needed level of care, that participate in Medicare, Medicaid or managed care program needed by the patient, have an available bed and are willing to accept the patient.    Patient/family informed of [facility]' ownership interest in [facility], as well as of the fact that they are under no obligation to receive care at this facility.  PASARR submitted to E[employer] on 11/21/2013 PASARR number received on 11/21/2013  FL2 transmitted to all facilities in geographic area requested by pt/family on  11/21/2013 FL2 transmitted to all facilities within larger geographic area on   Patient informed that his/her managed care company has contracts with or will negotiate with  certain facilities, including the following:     Patient/family informed of bed offers received:   Patient chooses bed at  Physician recommends and patient chooses bed at    Patient to be transferred to  on   Patient to be transferred to facility by  Patient and family notified of transfer on  Name of family member notified:    The following physician request were entered in Epic:   Additional Comments:  Cori Razor LCSW [phone]

## 2013-11-22 MED ORDER — PANTOPRAZOLE SODIUM 20 MG PO TBEC
20.0000 mg | DELAYED_RELEASE_TABLET | Freq: Every day | ORAL | Status: DC
  Administered 2013-11-22 – 2013-11-23 (×2): 20 mg via ORAL
  Filled 2013-11-22 (×2): qty 1

## 2013-11-22 MED ORDER — METHOCARBAMOL 500 MG PO TABS: 500.0000 mg | ORAL_TABLET | Freq: Four times a day (QID) | ORAL | Status: DC | PRN

## 2013-11-22 MED ORDER — OXYCODONE-ACETAMINOPHEN 5-325 MG PO TABS: 1.0000 | ORAL_TABLET | ORAL | Status: DC | PRN

## 2013-11-22 NOTE — Discharge Instructions (Signed)
Increase activities as comfort allows. Ice to right pelvis as needed for swelling.

## 2013-11-22 NOTE — Progress Notes (Signed)
Physical Therapy Treatment Patient Details Name: Wendie Diskin MRN: [id number] DOB: 1945-06-19 Today's Date: 11/22/2013    History of Present Illness 68 yo female admitted 8/30 after  being kicked by a horse, sustained R iliac wing fz, hematoma.    PT Comments    Improving in mobility, decreased pain. Will benefit from ST rehab.  Follow Up Recommendations  SNF     Equipment Recommendations  Rolling walker with 5" wheels;3in1 (PT)    Recommendations for Other Services       Precautions / Restrictions Precautions Precaution Comments: r Thigh hematoma    Mobility  Bed Mobility   Bed Mobility: Sit to Supine;Supine to Sit     Supine to sit: HOB elevated;Mod assist Sit to supine: Min guard   General bed mobility comments: assist with R leg off of bed, heel tends to stick in sheets.  Transfers Overall transfer level: Needs assistance Equipment used: Rolling walker (2 wheeled) Transfers: Sit to/from Stand Sit to Stand: Min guard         General transfer comment: cues for hand placement  Ambulation/Gait Ambulation/Gait assistance: Min guard Ambulation Distance (Feet): 100 Feet Assistive device: Rolling walker (2 wheeled)   Gait velocity: decreased   General Gait Details: increased weight on R hip per patient. cues for sequence   Stairs            Wheelchair Mobility    Modified Rankin (Stroke Patients Only)       Balance                                    Cognition Arousal/Alertness: Awake/alert                          Exercises General Exercises - Lower Extremity Short Arc Quad: AROM;Right;10 reps;Supine Heel Slides: AAROM;Right;10 reps;Supine Hip ABduction/ADduction: AAROM;Right;10 reps;Supine    General Comments        Pertinent Vitals/Pain Pain Score: 3  Pain Location: r hip Pain Descriptors / Indicators: Tightness    Home Living                      Prior Function            PT Goals  (current goals can now be found in the care plan section) Progress towards PT goals: Progressing toward goals    Frequency  Min 6X/week    PT Plan Current plan remains appropriate    Co-evaluation             End of Session   Activity Tolerance: Patient tolerated treatment well Patient left: in bed;with call bell/phone within reach     Time: 1620-1650 PT Time Calculation (min): 30 min  Charges:  $Gait Training: 8-22 mins $Therapeutic Exercise: 8-22 mins                    G Codes:      Rada Hay 11/22/2013, 5:23 PM Blanchard Kelch PT [phone]

## 2013-11-22 NOTE — Progress Notes (Signed)
Occupational Therapy Treatment Patient Details Name: [name] French MRN: [id number] DOB: 12/14/1945 Today's Date: 11/22/2013    History of present illness 68 yo female admitted 8/30 after  being kicked by a horse, sustained R iliac wing fz, hematoma.   OT comments  Pt is making good progress in OT.  Able to tolerate releasing hand in standing for adls.  Educated on Sports administrator for adls  Follow Up Recommendations  SNF    Equipment Recommendations       Recommendations for Other Services      Precautions / Restrictions Precautions Precaution Comments: r Thigh hematoma Restrictions RLE Weight Bearing: Weight bearing as tolerated       Mobility Bed Mobility           Sit to supine: Min guard (extra time)      Transfers   Equipment used: Rolling walker (2 wheeled) Transfers: Sit to/from Stand Sit to Stand: Min guard         General transfer comment: cues for hand placement    Balance                                   ADL               Lower Body Bathing: Minimal assistance;Sit to/from stand       Lower Body Dressing: Minimal assistance;Sit to/from stand           Tub/ Shower Transfer: Min guard;Ambulation     General ADL Comments: Pt was seated in shower when OT arrived.  Able to bend LLE up to reach foot:  assisted with R.  Educated on reacher for adls.  She was able to don underwear without AE, extra time needed.  She did did not want socks on right away.  Pt able to cross LLE under R and lift onto bed with extra time.  Cues for sequence with stepping out of shower, but pt found it easier to bring L down first.  Now able to release both hands from walker for adls.      Vision                     Perception     Praxis      Cognition   Behavior During Therapy: WFL for tasks assessed/performed Overall Cognitive Status: Within Functional Limits for tasks assessed                       Extremity/Trunk  Assessment               Exercises     Shoulder Instructions       General Comments      Pertinent Vitals/ Pain       Pain Score: 5  Pain Location: R hip Pain Descriptors / Indicators: Throbbing;Sore Pain Intervention(s): Premedicated before session;Monitored during session;Limited activity within patient's tolerance; repositioned  Home Living                                          Prior Functioning/Environment              Frequency Min 2X/week     Progress Toward Goals  OT Goals(current goals can now be found in the care plan section)  Progress towards OT goals:  Progressing toward goals     Plan      Co-evaluation                 End of Session     Activity Tolerance Patient tolerated treatment well   Patient Left in bed;with call bell/phone within reach   Nurse Communication          Time: 1610-9604 OT Time Calculation (min): 23 min  Charges: OT General Charges $OT Visit: 1 Procedure OT Treatments $Self Care/Home Management : 8-22 mins  Ayaka Andes 11/22/2013, 10:30 AM Marica Otter, OTR/L [phone] 11/22/2013

## 2013-11-22 NOTE — Progress Notes (Signed)
Patient ID: [name] French, female   DOB: 1945-09-11, 68 y.o.   MRN: [id number] Slowly mobilizing with therapy due to significant discomfort from her right-sided pelvic fracture (to be expected).  She does live alone with bedrooms on the upstairs.  Will likely need short-term SNF placement upon discharge from her inpatient stay.

## 2013-11-22 NOTE — Care Management Note (Signed)
    Page 1 of 2   11/22/2013     10:29:50 AM CARE MANAGEMENT NOTE 11/22/2013  Patient:  Cupo,Phelan   Account Number:  [id number]  Date Initiated:  11/21/2013  Documentation initiated by:  DAVIS,RHONDA  Subjective/Objective Assessment:   68 yo female who was kicked in her right hip/pelvic area by a horse.  right pelvic iliac wing fracture. Acute displaced fracture through the right iliac wing extending from the anterior superior iliac spine through the iliac crest. There     Action/Plan:   home when stable   Anticipated DC Date:  11/23/2013   Anticipated DC Plan:  SKILLED NURSING FACILITY  In-house referral  NA  NA      DC Planning Services  CM consult      PAC Choice  NA   Choice offered to / List presented to:  NA      DME agency  NA     HH arranged  NA      HH agency  NA  NA   Status of service:  Completed, signed off Medicare Important Message given?  NA - LOS <3 / Initial given by admissions (If response is "NO", the following Medicare IM given date fields will be blank) Date Medicare IM given:   Medicare IM given by:   Date Additional Medicare IM given:   Additional Medicare IM given by:    Discharge Disposition:  SKILLED NURSING FACILITY  Per UR Regulation:  Reviewed for med. necessity/level of care/duration of stay  If discussed at Long Length of Stay Meetings, dates discussed:    Comments:  11/22/13 CM reviewed and pt disposition is planed for SNF for rehab.  CSW arranging SNF.  No other Cm needs were communicated.  Freddy Jaksch, BSN, CM [phone].  Rhonda Davis,RN,BSN,CCM

## 2013-11-22 NOTE — Progress Notes (Signed)
Patient ID: [name] French, female   DOB: 1945-08-26, 68 y.o.   MRN: [id number] The plan is to discharge [name] to [facility] 9/2 for further therapy.

## 2013-11-23 NOTE — Progress Notes (Signed)
Patient ID: [name] French, female   DOB: [birth year], 68 y.o.   MRN: [id number] Patient comfortable this AM ready for discharge to skilled nursing faciity . Patient will follow up with Dr. Magnus Ivan in office in one week. Patient stable and ready for discharge To SNF.

## 2013-11-23 NOTE — Progress Notes (Signed)
Clinical Social Work Department CLINICAL SOCIAL WORK PLACEMENT NOTE 11/23/2013  Patient:  [name] French,[name] French  Account Number:  [id number] Admit date:  11/20/2013  Clinical Social Worker:  Cori Razor, LCSW  Date/time:  11/21/2013 04:53 PM  Clinical Social Work is seeking post-discharge placement for this patient at the following level of care:   SKILLED NURSING   (*CSW will update this form in Epic as items are completed)     Patient/family provided with [facility] Department of Clinical Social Work's list of facilities offering this level of care within the geographic area requested by the patient (or if unable, by the patient's family).  11/21/2013  Patient/family informed of their freedom to choose among providers that offer the needed level of care, that participate in Medicare, Medicaid or managed care program needed by the patient, have an available bed and are willing to accept the patient.    Patient/family informed of [facility]' ownership interest in [facility], as well as of the fact that they are under no obligation to receive care at this facility.  PASARR submitted to EDS on 11/21/2013 PASARR number received on 11/21/2013  FL2 transmitted to all facilities in geographic area requested by pt/family on  11/21/2013 FL2 transmitted to all facilities within larger geographic area on   Patient informed that his/her managed care company has contracts with or will negotiate with  certain facilities, including the following:     Patient/family informed of bed offers received:  11/23/2013 Patient chooses bed at [facility] Physician recommends and patient chooses bed at    Patient to be transferred to [facility] on  11/23/2013 Patient to be transferred to facility by car Patient and family notified of transfer on 11/23/2013 Name of family member notified:  Pt declined CSW assistance.  The following physician request were entered in Epic:   Additional  Comments: Pt agrees with d/c  to SNF today. PT agrees with transport by car. Nsg reviewed d/c summary, scritps, avs. Scripts included in d/c packet.  Cori Razor LCSW [phone]

## 2013-11-23 NOTE — Discharge Summary (Signed)
Patient ID: [name] French MRN: [id number] DOB/AGE: [birth year] 68 y.o.  Admit date: 11/20/2013 Discharge date: 11/23/2013  Admission Diagnoses:  Principal Problem:   Fracture of right iliac wing Active Problems:   Fracture of iliac crest   Fracture of iliac wing   Discharge Diagnoses:  Same  History reviewed. No pertinent past medical history.  Surgeries:  on * No surgery found *   Consultants: Treatment Team:  Kathryne Hitch, MD  Discharged Condition: Improved  Hospital Course: [name] French is an 68 y.o. female who was admitted 11/20/2013 for operative treatment ofFracture of right iliac wing. Patient has severe unremitting pain that affects sleep, daily activities, and work/hobbies. After pre-op clearance the patient was taken to the operating room on * No surgery found * and underwent  .    Patient was given perioperative antibiotics: Anti-infectives   None       Patient was given sequential compression devices, early ambulation, and chemoprophylaxis to prevent DVT.  Patient benefited maximally from hospital stay and there were no complications.    Recent vital signs: Patient Vitals for the past 24 hrs:  BP Temp Temp src Pulse Resp SpO2  11/23/13 0643 121/57 mmHg 98.1 F (36.7 C) - 56 18 99 %  11/22/13 2044 100/48 mmHg 98.9 F (37.2 C) Oral 58 16 99 %  11/22/13 1433 116/57 mmHg 97.5 F (36.4 C) Oral 56 - 100 %     Recent laboratory studies:  Recent Labs  11/20/13 1530  WBC 14.9*  HGB 13.0  HCT 38.5  PLT 305  NA 140  K 3.5*  CL 101  CO2 23  BUN 21  CREATININE 0.77  GLUCOSE 141*  INR 0.96  CALCIUM 9.3     Discharge Medications:     Medication List         calcium-vitamin D 500-200 MG-UNIT per tablet  Commonly known as:  OSCAL WITH D  Take 1 tablet by mouth daily with breakfast.     FISH OIL PO  Take 1 tablet by mouth daily.     Lutein 10 MG Tabs  Take 1 tablet by mouth daily.     methocarbamol 500 MG tablet  Commonly  known as:  ROBAXIN  Take 1 tablet (500 mg total) by mouth every 6 (six) hours as needed for muscle spasms.     multivitamin with minerals Tabs tablet  Take 1 tablet by mouth daily.     oxyCODONE-acetaminophen 5-325 MG per tablet  Commonly known as:  ROXICET  Take 1-2 tablets by mouth every 4 (four) hours as needed.     pneumococcal 13-valent conjugate vaccine Susp injection  Commonly known as:  PREVNAR 13  Inject 0.5 mLs into the muscle once.     VITAMIN D PO  Take 1 tablet by mouth daily.        Diagnostic Studies: Dg Hip Complete Right  11/20/2013   CLINICAL DATA:  Right hip pain, kicked by horse  EXAM: RIGHT HIP - COMPLETE 2+ VIEW  COMPARISON:  None.  FINDINGS: Acute displaced fracture through the right iliac wing. There is extensive surrounding hematoma and soft tissue swelling involving the gluteal musculature and overlying adipose tissues. The right sacroiliac joint appears intact. The femoral head remains located.  IMPRESSION: Acute displaced fracture through the right iliac wing extending from the anterior superior iliac spine through the iliac crest.  There is surrounding soft tissue swelling and hematoma.   Electronically Signed   By: Isac Caddy.D.  On: 11/20/2013 14:35   Ct Abdomen Pelvis W Contrast  11/20/2013   CLINICAL DATA:  trauma protocol  EXAM: CT ABDOMEN AND PELVIS WITH CONTRAST  TECHNIQUE: Multidetector CT imaging of the abdomen and pelvis was performed using the standard protocol following bolus administration of intravenous contrast.  CONTRAST:  OMNIPAQUE IOHEXOL 300 MG/ML  SOLN  COMPARISON:  None.  FINDINGS: Minimal dependent atelectasis in the visualized lung bases. Unremarkable liver, nondistended gallbladder, spleen, adrenal glands, pancreas. Left parapelvic renal cyst. Renal parenchyma otherwise unremarkable bilaterally. No hydronephrosis. Minimal infrarenal aortic plaque. IVC and portal vein patent. Stomach, small bowel, and colon are nondilated.  Urinary bladder is incompletely distended. Bilateral pelvic phleboliths.  There is a displaced fracture of the right iliac wing, major fracture fragments displaced approximately 15 mm. This does not extend to involve the acetabulum or SI joint. Hips are intact. There is a right pelvic extraperitoneal hematoma adjacent to the iliacus and right gluteal musculature. No other acute bone abnormality. Mild spondylitic changes in the lower lumbar spine.  IMPRESSION: 1. Displaced right iliac wing fracture with adjacent  hematoma. 2. No acute intra-abdominal process.   Electronically Signed   By: Oley Balm M.D.   On: 11/20/2013 17:15    Disposition:  To skilled nursing facility      Discharge Instructions   Call MD / Call 911    Complete by:  As directed   If you experience chest pain or shortness of breath, CALL 911 and be transported to the hospital emergency room.  If you develope a fever above 101 F, pus (white drainage) or increased drainage or redness at the wound, or calf pain, call your surgeon's office.     Constipation Prevention    Complete by:  As directed   Drink plenty of fluids.  Prune juice may be helpful.  You may use a stool softener, such as Colace (over the counter) 100 mg twice a day.  Use MiraLax (over the counter) for constipation as needed.     Diet - low sodium heart healthy    Complete by:  As directed      Discharge instructions    Complete by:  As directed   Increase activities as comfort allows. Full weight bearing as tolerated right hip.     Discharge patient    Complete by:  As directed      Increase activity slowly as tolerated    Complete by:  As directed            Follow-up Information   Follow up with Kathryne Hitch, MD. Schedule an appointment as soon as possible for a visit in 1 week.   Specialty:  Orthopedic Surgery   Contact information:   [address] [place] [address] [phone]        Signed: Kathryne Hitch 11/23/2013, 6:51 AM

## 2013-11-26 ENCOUNTER — Non-Acute Institutional Stay (SKILLED_NURSING_FACILITY): Payer: Medicare Other | Admitting: Adult Health

## 2013-11-26 ENCOUNTER — Encounter: Payer: Self-pay | Admitting: Adult Health

## 2013-11-26 DIAGNOSIS — S32309A Unspecified fracture of unspecified ilium, initial encounter for closed fracture: Secondary | ICD-10-CM

## 2013-11-26 DIAGNOSIS — K5900 Constipation, unspecified: Secondary | ICD-10-CM

## 2013-11-26 DIAGNOSIS — S32301A Unspecified fracture of right ilium, initial encounter for closed fracture: Secondary | ICD-10-CM

## 2013-11-26 NOTE — Progress Notes (Signed)
Patient ID: [name] French, female   DOB: Mar 22, 1946, 68 y.o.   MRN: [id number]              PROGRESS NOTE  DATE: 11/26/2013  FACILITY: Nursing Home Location: [facility]  LEVEL OF CARE: SNF (31)  Acute Visit  CHIEF COMPLAINT:  Follow-up Hospitalization  HISTORY OF PRESENT ILLNESS: This is a 68 year old female who has been admitted to [facility] on 11/23/13 from [facility] with Fracture of right iliac crest. She has been admitted for a short-term rehabilitation.  REASSESSMENT OF ONGOING PROBLEM(S):  CONSTIPATION: The constipation remains a problem. No complications from the medications presently being used. Patient complains of constipation.    PAST MEDICAL HISTORY : Reviewed.  No changes/see problem list  CURRENT MEDICATIONS: Reviewed per MAR/see medication list  REVIEW OF SYSTEMS:  GENERAL: no change in appetite, no fatigue, no weight changes, no fever, chills or weakness RESPIRATORY: no cough, SOB, DOE, wheezing, hemoptysis CARDIAC: no chest pain, edema or palpitations GI: no abdominal pain, diarrhea, heart burn, nausea or vomiting, + constipation  PHYSICAL EXAMINATION  GENERAL: no acute distress, normal body habitus EYES: conjunctivae normal, sclerae normal, normal eye lids NECK: supple, trachea midline, no neck masses, no thyroid tenderness, no thyromegaly LYMPHATICS: no LAN in the neck, no supraclavicular LAN RESPIRATORY: breathing is even & unlabored, BS CTAB CARDIAC: RRR, no murmur,no extra heart sounds, no edema GI: abdomen soft, normal BS, no masses, no tenderness, no hepatomegaly, no splenomegaly EXTREMITIES:  Able to ambulate with walker PSYCHIATRIC: the patient is alert & oriented to person, affect & behavior appropriate  LABS/RADIOLOGY: Labs reviewed: Basic Metabolic Panel:  Recent Labs  40/98/11 1530  NA 140  K 3.5*  CL 101  CO2 23  GLUCOSE 141*  BUN 21  CREATININE 0.77  CALCIUM 9.3   CBC:  Recent Labs  11/20/13 1530  WBC 14.9*  NEUTROABS 12.6*  HGB 13.0  HCT 38.5  MCV 91.2  PLT 305    EXAM: CT ABDOMEN AND PELVIS WITH CONTRAST   TECHNIQUE: Multidetector CT imaging of the abdomen and pelvis was performed using the standard protocol following bolus administration of intravenous contrast.   CONTRAST:  OMNIPAQUE IOHEXOL 300 MG/ML  SOLN   COMPARISON:  None.   FINDINGS: Minimal dependent atelectasis in the visualized lung bases. Unremarkable liver, nondistended gallbladder, spleen, adrenal glands, pancreas. Left parapelvic renal cyst. Renal parenchyma otherwise unremarkable bilaterally. No hydronephrosis. Minimal infrarenal aortic plaque. IVC and portal vein patent. Stomach, small bowel, and colon are nondilated. Urinary bladder is incompletely distended. Bilateral pelvic phleboliths.   There is a displaced fracture of the right iliac wing, major fracture fragments displaced approximately 15 mm. This does not extend to involve the acetabulum or SI joint. Hips are intact. There is a right pelvic extraperitoneal hematoma adjacent to the iliacus and right gluteal musculature. No other acute bone abnormality. Mild spondylitic changes in the lower lumbar spine.   IMPRESSION: 1. Displaced right iliac wing fracture with adjacent  hematoma. 2. No acute intra-abdominal process.   EXAM: RIGHT HIP - COMPLETE 2+ VIEW   COMPARISON:  None.   FINDINGS: Acute displaced fracture through the right iliac wing. There is extensive surrounding hematoma and soft tissue swelling involving the gluteal musculature and overlying adipose tissues. The right sacroiliac joint appears intact. The femoral head remains located.   IMPRESSION: Acute displaced fracture through the right iliac wing extending from the anterior superior iliac spine through the iliac crest.   There  is surrounding soft tissue swelling and hematoma   ASSESSMENT/PLAN:  Fracture of right iliac crest - for  rehabitation Constipation - add Colace 100 mg by mouth twice a day and MiraLax 17 g last 6-8 ounces liquid by mouth daily; continue Senokot   CPT CODE: 29562  Ella Bodo - NP [facility] [phone]

## 2013-11-29 ENCOUNTER — Other Ambulatory Visit: Payer: Self-pay | Admitting: *Deleted

## 2013-11-29 MED ORDER — HYDROCODONE-ACETAMINOPHEN 5-325 MG PO TABS: ORAL_TABLET | ORAL | Status: DC

## 2013-11-29 NOTE — Telephone Encounter (Signed)
[facility] 

## 2013-12-01 ENCOUNTER — Non-Acute Institutional Stay (SKILLED_NURSING_FACILITY): Payer: Medicare Other | Admitting: Internal Medicine

## 2013-12-01 DIAGNOSIS — S32301S Unspecified fracture of right ilium, sequela: Secondary | ICD-10-CM

## 2013-12-01 DIAGNOSIS — R739 Hyperglycemia, unspecified: Secondary | ICD-10-CM

## 2013-12-01 DIAGNOSIS — K5900 Constipation, unspecified: Secondary | ICD-10-CM

## 2013-12-01 DIAGNOSIS — R7309 Other abnormal glucose: Secondary | ICD-10-CM

## 2013-12-01 DIAGNOSIS — IMO0002 Reserved for concepts with insufficient information to code with codable children: Secondary | ICD-10-CM

## 2013-12-02 ENCOUNTER — Non-Acute Institutional Stay (SKILLED_NURSING_FACILITY): Payer: Medicare Other | Admitting: Adult Health

## 2013-12-02 ENCOUNTER — Encounter: Payer: Self-pay | Admitting: Adult Health

## 2013-12-02 DIAGNOSIS — IMO0001 Reserved for inherently not codable concepts without codable children: Secondary | ICD-10-CM

## 2013-12-02 DIAGNOSIS — K5900 Constipation, unspecified: Secondary | ICD-10-CM

## 2013-12-02 DIAGNOSIS — S32301D Unspecified fracture of right ilium, subsequent encounter for fracture with routine healing: Secondary | ICD-10-CM

## 2013-12-02 NOTE — Progress Notes (Signed)
Patient ID: [name] French, female   DOB: 12/24/1945, 68 y.o.   MRN: [id number]               PROGRESS NOTE  DATE:     12/02/13  FACILITY: Nursing Home Location: [facility]  LEVEL OF CARE: SNF (31)  Acute Visit  CHIEF COMPLAINT:  Discharge Notes  HISTORY OF PRESENT ILLNESS: This is a 68 year old female who is for discharge home with Home health PT and OT. DME: Rolling walker. She has been admitted to [facility] on 11/23/13 from [facility] with Fracture of right iliac crest. Patient was admitted to this facility for short-term rehabilitation after the patient's recent hospitalization.  Patient has completed SNF rehabilitation and therapy has cleared the patient for discharge.  REASSESSMENT OF ONGOING PROBLEM(S):  CONSTIPATION: The constipation remains stable. No complications from the medications presently being used. Patient denies ongoing constipation, abdominal pain, nausea or vomiting.  PAST MEDICAL HISTORY : Reviewed.  No changes/see problem list  CURRENT MEDICATIONS: Reviewed per MAR/see medication list  REVIEW OF SYSTEMS:  GENERAL: no change in appetite, no fatigue, no weight changes, no fever, chills or weakness RESPIRATORY: no cough, SOB, DOE, wheezing, hemoptysis CARDIAC: no chest pain, edema or palpitations GI: no abdominal pain, diarrhea, heart burn, nausea or vomiting  PHYSICAL EXAMINATION  GENERAL: no acute distress, normal body habitus NECK: supple, trachea midline, no neck masses, no thyroid tenderness, no thyromegaly LYMPHATICS: no LAN in the neck, no supraclavicular LAN RESPIRATORY: breathing is even & unlabored, BS CTAB CARDIAC: RRR, no murmur,no extra heart sounds, no edema GI: abdomen soft, normal BS, no masses, no tenderness, no hepatomegaly, no splenomegaly EXTREMITIES: ambulates with walker PSYCHIATRIC: the patient is alert & oriented to person, affect & behavior appropriate  LABS/RADIOLOGY: Labs reviewed: Basic  Metabolic Panel:  Recent Labs  40/98/11 1530  NA 140  K 3.5*  CL 101  CO2 23  GLUCOSE 141*  BUN 21  CREATININE 0.77  CALCIUM 9.3   CBC:  Recent Labs  11/20/13 1530  WBC 14.9*  NEUTROABS 12.6*  HGB 13.0  HCT 38.5  MCV 91.2  PLT 305    EXAM: CT ABDOMEN AND PELVIS WITH CONTRAST   TECHNIQUE: Multidetector CT imaging of the abdomen and pelvis was performed using the standard protocol following bolus administration of intravenous contrast.   CONTRAST:  OMNIPAQUE IOHEXOL 300 MG/ML  SOLN   COMPARISON:  None.   FINDINGS: Minimal dependent atelectasis in the visualized lung bases. Unremarkable liver, nondistended gallbladder, spleen, adrenal glands, pancreas. Left parapelvic renal cyst. Renal parenchyma otherwise unremarkable bilaterally. No hydronephrosis. Minimal infrarenal aortic plaque. IVC and portal vein patent. Stomach, small bowel, and colon are nondilated. Urinary bladder is incompletely distended. Bilateral pelvic phleboliths.   There is a displaced fracture of the right iliac wing, major fracture fragments displaced approximately 15 mm. This does not extend to involve the acetabulum or SI joint. Hips are intact. There is a right pelvic extraperitoneal hematoma adjacent to the iliacus and right gluteal musculature. No other acute bone abnormality. Mild spondylitic changes in the lower lumbar spine.   IMPRESSION: 1. Displaced right iliac wing fracture with adjacent  hematoma. 2. No acute intra-abdominal process.   EXAM: RIGHT HIP - COMPLETE 2+ VIEW   COMPARISON:  None.   FINDINGS: Acute displaced fracture through the right iliac wing. There is extensive surrounding hematoma and soft tissue swelling involving the gluteal musculature and overlying adipose tissues. The right sacroiliac joint appears intact. The  femoral head remains located.   IMPRESSION: Acute displaced fracture through the right iliac wing extending from the anterior superior  iliac spine through the iliac crest.   There is surrounding soft tissue swelling and hematoma   ASSESSMENT/PLAN:  Fracture of right iliac crest - for home health PT and OT  Constipation - stable; continue Colace, senna and MiraLax   I have filled out patient's discharge paperwork and written prescriptions.  Patient will receive home health PT and OT.  DME provided: Rolling walker  Total discharge time: Greater than 30 minutes  Discharge time involved coordination of the discharge process with Child psychotherapist, nursing staff and therapy department. Medical justification for home health services/DME verified.  CPT CODE: 40981   Ella Bodo - NP [facility] [phone]

## 2013-12-03 NOTE — Progress Notes (Signed)
HISTORY & PHYSICAL  DATE: 12/01/2013   FACILITY: [facility]  LEVEL OF CARE: SNF (31)  ALLERGIES:  No Known Allergies  CHIEF COMPLAINT:  Manage fracture of the right iliac wing, constipation & hyperglycemia  HISTORY OF PRESENT ILLNESS: The patient is a 68 year old Caucasian female.  FRACTURE OF RIGHT ILIAC WING: The patient sustained fracture of the right iliac crest and right iliac wing. Patient is on coservative management. She denies pelvic pain. She is tolerating her pain medications without any side effects.  CONSTIPATION: The constipation remains stable. No complications from the medications presently being used. Patient denies ongoing constipation, abdominal pain, nausea or vomiting.  HYPERGLYCEMIA: Patient's blood glucose is 141. She does not have a history of diabetes mellitus. She is not on prednisone. Patient denies polyuria, polyphagia or polydipsia.  PAST MEDICAL HISTORY : none  PAST SURGICAL HISTORY: Past Surgical History  Procedure Laterality Date  . Finger surgery Right     ring  . Breast surgery Left     Ca++ deposits removed    SOCIAL HISTORY:  reports that she has never smoked. She does not have any smokeless tobacco history on file. She reports that she drinks alcohol. She reports that she does not use illicit drugs.  FAMILY HISTORY: None  CURRENT MEDICATIONS: Reviewed per MAR/see medication list  REVIEW OF SYSTEMS:  See HPI otherwise 14 point ROS is negative.  PHYSICAL EXAMINATION  VS:  See VS section  GENERAL: no acute distress, normal body habitus EYES: conjunctivae normal, sclerae normal, normal eye lids MOUTH/THROAT: lips without lesions,no lesions in the mouth,tongue is without lesions,uvula elevates in midline NECK: supple, trachea midline, no neck masses, no thyroid tenderness, no thyromegaly LYMPHATICS: no LAN in the neck, no supraclavicular LAN RESPIRATORY: breathing is even & unlabored, BS CTAB CARDIAC:  RRR, no murmur,no extra heart sounds, no edema GI:  ABDOMEN: abdomen soft, normal BS, no masses, no tenderness  LIVER/SPLEEN: no hepatomegaly, no splenomegaly MUSCULOSKELETAL: HEAD: normal to inspection  EXTREMITIES: LEFT UPPER EXTREMITY: full range of motion, normal strength & tone RIGHT UPPER EXTREMITY:  full range of motion, normal strength & tone LEFT LOWER EXTREMITY:  Moderate range of motion, normal strength & tone RIGHT LOWER EXTREMITY: Moderate range of motion, normal strength & tone PSYCHIATRIC: the patient is alert & oriented to person, affect & behavior appropriate  LABS/RADIOLOGY:  Labs reviewed: Basic Metabolic Panel:  Recent Labs  09/81/19 1530  NA 140  K 3.5*  CL 101  CO2 23  GLUCOSE 141*  BUN 21  CREATININE 0.77  CALCIUM 9.3   CBC:  Recent Labs  11/20/13 1530  WBC 14.9*  NEUTROABS 12.6*  HGB 13.0  HCT 38.5  MCV 91.2  PLT 305    RIGHT HIP - COMPLETE 2+ VIEW   COMPARISON:  None.   FINDINGS: Acute displaced fracture through the right iliac wing. There is extensive surrounding hematoma and soft tissue swelling involving the gluteal musculature and overlying adipose tissues. The right sacroiliac joint appears intact. The femoral head remains located.   IMPRESSION: Acute displaced fracture through the right iliac wing extending from the anterior superior iliac spine through the iliac crest.   There is surrounding soft tissue swelling and hematoma. CT ABDOMEN AND PELVIS WITH CONTRAST   TECHNIQUE: Multidetector CT imaging of the abdomen and pelvis was performed using the standard protocol following bolus administration of intravenous contrast.   CONTRAST:  OMNIPAQUE IOHEXOL 300 MG/ML  SOLN  COMPARISON:  None.   FINDINGS: Minimal dependent atelectasis in the visualized lung bases. Unremarkable liver, nondistended gallbladder, spleen, adrenal glands, pancreas. Left parapelvic renal cyst. Renal parenchyma otherwise unremarkable  bilaterally. No hydronephrosis. Minimal infrarenal aortic plaque. IVC and portal vein patent. Stomach, small bowel, and colon are nondilated. Urinary bladder is incompletely distended. Bilateral pelvic phleboliths.   There is a displaced fracture of the right iliac wing, major fracture fragments displaced approximately 15 mm. This does not extend to involve the acetabulum or SI joint. Hips are intact. There is a right pelvic extraperitoneal hematoma adjacent to the iliacus and right gluteal musculature. No other acute bone abnormality. Mild spondylitic changes in the lower lumbar spine.   IMPRESSION: 1. Displaced right iliac wing fracture with adjacent  hematoma. 2. No acute intra-abdominal process.   ASSESSMENT/PLAN:  Fracture of right iliac wing and crest-continue rehabilitation. Constipation-continue Colace and MiraLax. Hyperglycemia-new problem. Recheck. Check CBC with differential and BMP  I have reviewed patient's medical records received at admission/from hospitalization.  CPT CODE: 16109  Angela Cox, MD [facility] [phone]

## 2013-12-05 DIAGNOSIS — IMO0001 Reserved for inherently not codable concepts without codable children: Secondary | ICD-10-CM

## 2013-12-05 DIAGNOSIS — W64XXXA Exposure to other animate mechanical forces, initial encounter: Secondary | ICD-10-CM

## 2013-12-05 DIAGNOSIS — M6281 Muscle weakness (generalized): Secondary | ICD-10-CM

## 2014-04-19 ENCOUNTER — Other Ambulatory Visit: Payer: Self-pay

## 2014-04-19 DIAGNOSIS — Z1231 Encounter for screening mammogram for malignant neoplasm of breast: Secondary | ICD-10-CM

## 2014-04-25 ENCOUNTER — Ambulatory Visit: Payer: Medicare Other

## 2014-05-02 ENCOUNTER — Ambulatory Visit
Admission: RE | Admit: 2014-05-02 | Discharge: 2014-05-02 | Disposition: A | Discharge status: Home / Self Care | Payer: Medicare Other | Source: Ambulatory Visit

## 2014-05-02 DIAGNOSIS — Z1231 Encounter for screening mammogram for malignant neoplasm of breast: Secondary | ICD-10-CM

## 2014-12-28 ENCOUNTER — Ambulatory Visit (INDEPENDENT_AMBULATORY_CARE_PROVIDER_SITE_OTHER): Payer: Medicare Other | Admitting: Sports Medicine

## 2014-12-28 ENCOUNTER — Encounter: Payer: Self-pay | Admitting: Sports Medicine

## 2014-12-28 VITALS — BP 154/57 | Ht 62.0 in | Wt 116.0 lb

## 2014-12-28 DIAGNOSIS — S7411XA Injury of femoral nerve at hip and thigh level, right leg, initial encounter: Secondary | ICD-10-CM | POA: Diagnosis not present

## 2014-12-28 DIAGNOSIS — M6289 Other specified disorders of muscle: Secondary | ICD-10-CM | POA: Insufficient documentation

## 2014-12-28 MED ORDER — GABAPENTIN 100 MG PO CAPS: 100.0000 mg | ORAL_CAPSULE | Freq: Three times a day (TID) | ORAL | Status: DC

## 2014-12-28 NOTE — Assessment & Plan Note (Signed)
Patient with evidence of hypoechoic changes of the articular branch of the femoral nerve on ultrasound. She has retraction of her TFL medial and distal which has causes compression of this region.   -She also has gluteus medius weakness which we will focus on to help increase her strength.  - start with B 6  For paresthesias and follow-up in 6 weeks. If she is not having improvement at that time consider low-dose Neurontin.  - start thigh sleeve compression to area to keep inflammation down.

## 2014-12-28 NOTE — Progress Notes (Signed)
  [name] - 69 y.o. female MRN [id number]  Date of birth: 1945-12-01  SUBJECTIVE:  Including CC & ROS.  [name] French is a 69 y.o. female who presents today for Right hip pain.    Hip Pain Right, initial visit -  Patient presents today with lateral hip pain along the proximal region. This is located around the ASIS. She previously had a right ASIS , iliac crest, iliac wing  Fracture in August 2015 that was not treated operatively. She was in the hospital for several days and required short-term nursing facility physical therapy to help with some of this. She was followed by Dr. Rayburn Ma at [facility] orthopedics and did not have any complications with her recovery. She describes nagging pain occasionally in the lateral aspect of her right hip that feels like a "charley horse". She has not tried anything for this. Denies any radiating paresthesias down the front or the back of the leg. She does do physical therapy with [facility] and works on multiple things that involve both of her hips. States that this feels more like a knot in the right lateral hip region.  PMHx - Updated and reviewed.  Contributory factors include:  Iliac Crest/Wing/ASIS fracture PSHx - Updated and reviewed.  Contributory factors include:   noncontributory FHx - Updated and reviewed.  Contributory factors include:   noncontributory Medications -  Vitamin D   12 point review of systems negative other than per history of present illness.   Exam:  Filed Vitals:   12/28/14 1025  BP: 154/57    Gen: NAD , AAO  3 Cardiorespiratory - Normal respiratory effort/rate.  RRR  skin : No rash /erythema  neuro: sensation intact right lower extremity. DTR 2/4 right lower extremity. Muscle strength 5 out of 5 bilateral lower extremity. Hip Exam:   no tenderness palpation ASIS or AIIS Standing hip rotation and gait without trendelenburg / unsteadiness.  tenderness palpation TFL region. There is a step-off deformity of the  proximal lateral TFL insertion. No tenderness over piriformis and greater trochanter. No SI joint tenderness and normal minimal SI movement. ROM: IR: 80 Deg, ER: 80 Deg, Flexion: 120 Deg, Extension: 100 Deg, Abduction: 45 Deg, Adduction: 45 Deg Strength:  IR: 5/5, ER: 5/5, Flexion (0 and 90 degrees): 5/5, Extension: 5/5, Abduction: 5/5, Adduction: 5/5 Negative Thomas test  Negative FADIR.  Negative FADIR with axial compression Negative FAIR and Freiberg  Negative FABER in all directions, negative posterior shear, negative Gaenslen  Negative Noble and Ober testing   Neurovascularly intact B/L LE  Imaging: [place] R hip  Shows distal fragmentation of the ASIS with evidence of a fragmentation that is pulled off the TFL insertion. The TFL is extremely large compared to her left side with a Popeye deformity. Evidence of hypoechoic changes of the articular branch of the femoral nerve which is causing compression in this region.

## 2015-02-08 ENCOUNTER — Ambulatory Visit (INDEPENDENT_AMBULATORY_CARE_PROVIDER_SITE_OTHER): Payer: Medicare Other | Admitting: Sports Medicine

## 2015-02-08 ENCOUNTER — Encounter: Payer: Self-pay | Admitting: Sports Medicine

## 2015-02-08 VITALS — BP 147/59

## 2015-02-08 DIAGNOSIS — S7411XA Injury of femoral nerve at hip and thigh level, right leg, initial encounter: Secondary | ICD-10-CM

## 2015-02-08 NOTE — Assessment & Plan Note (Signed)
Previous injury to TFL medial and distal which has causes compression of this region with nerve impingement off articular branch of femoral nerve - Continue w/ gluteus medius strengthening/stretching  - Continue B 6, compression sleeve w/ yoga, and f/u in 6 months for repeat scan.  Consider gabapentin if not having improvement.

## 2015-02-08 NOTE — Progress Notes (Signed)
  [name] French Kamath - 69 y.o. female MRN [id number]  Date of birth: 1945/07/07  SUBJECTIVE:  Including CC & ROS.  [name] French Cada is a 69 y.o. female who presents today for Right hip pain.    Hip Pain Right, 12/28/14 -  Patient presents today with lateral hip pain along the proximal region. This is located around the ASIS. She previously had a right ASIS , iliac crest, iliac wing  Fracture in August 2015 that was not treated operatively. She was in the hospital for several days and required short-term nursing facility physical therapy to help with some of this. She was followed by Dr. Rayburn MaBlackmon at [facility] orthopedics and did not have any complications with her recovery. She describes nagging pain occasionally in the lateral aspect of her right hip that feels like a "charley horse". She has not tried anything for this. Denies any radiating paresthesias down the front or the back of the leg. She [facility] and works on multiple things that involve both of her hips. States that this feels more like a knot in the right lateral hip region.  F/U 02/08/15 - Pt about 60-75% improved and only getting pain with yoga/prolonged static isometric muscle contractions of the R leg.  Taking B 6 w/ good relief and nothing else.  Wearing sleeve w/ yoga but can't wear when horseback riding.  No new Sx or injury.   PMHx - Updated and reviewed.  Contributory factors include:  Iliac Crest/Wing/ASIS fracture PSHx - Updated and reviewed.  Contributory factors include:   noncontributory FHx - Updated and reviewed.  Contributory factors include:   noncontributory Medications -  Vitamin D, Vit B 6    12 point review of systems negative other than per history of present illness.   Exam:  Filed Vitals:   02/08/15 1125  BP: 147/59    Gen: NAD , AAO  3 Cardiorespiratory - Normal respiratory effort/rate.  RRR  skin : No rash /erythema  neuro: sensation intact right lower extremity. DTR 2/4  right lower extremity. Muscle strength 5 out of 5 bilateral lower extremity. Hip Exam:   no tenderness palpation ASIS or AIIS Standing hip rotation and gait without trendelenburg / unsteadiness.  Minimal TTP TFL region R.  ROM: IR: 80 Deg, ER: 80 Deg, Flexion: 120 Deg, Extension: 100 Deg, Abduction: 45 Deg, Adduction: 45 Deg Strength:  IR: 5/5, ER: 5/5, Flexion (0 and 90 degrees): 5/5, Extension: 5/5, Abduction: 5/5, Adduction: 5/5  Neurovascularly intact B/L LE

## 2015-06-11 ENCOUNTER — Other Ambulatory Visit: Payer: Self-pay

## 2015-06-11 DIAGNOSIS — Z1231 Encounter for screening mammogram for malignant neoplasm of breast: Secondary | ICD-10-CM

## 2015-06-27 ENCOUNTER — Ambulatory Visit
Admission: RE | Admit: 2015-06-27 | Discharge: 2015-06-27 | Disposition: A | Discharge status: Home / Self Care | Payer: Medicare Other | Source: Ambulatory Visit

## 2015-06-27 DIAGNOSIS — Z1231 Encounter for screening mammogram for malignant neoplasm of breast: Secondary | ICD-10-CM

## 2015-07-10 ENCOUNTER — Ambulatory Visit (INDEPENDENT_AMBULATORY_CARE_PROVIDER_SITE_OTHER): Payer: Medicare Other | Admitting: Sports Medicine

## 2015-07-10 ENCOUNTER — Encounter: Payer: Self-pay | Admitting: Sports Medicine

## 2015-07-10 VITALS — BP 120/64 | Ht 62.5 in | Wt 116.0 lb

## 2015-07-10 DIAGNOSIS — M6580 Other synovitis and tenosynovitis, unspecified site: Secondary | ICD-10-CM | POA: Diagnosis not present

## 2015-07-10 DIAGNOSIS — S32301D Unspecified fracture of right ilium, subsequent encounter for fracture with routine healing: Secondary | ICD-10-CM | POA: Diagnosis not present

## 2015-07-10 DIAGNOSIS — S7411XA Injury of femoral nerve at hip and thigh level, right leg, initial encounter: Secondary | ICD-10-CM

## 2015-07-10 DIAGNOSIS — M6289 Other specified disorders of muscle: Secondary | ICD-10-CM

## 2015-07-10 MED ORDER — NITROGLYCERIN 0.2 MG/HR TD PT24: MEDICATED_PATCH | TRANSDERMAL | Status: DC

## 2015-07-10 NOTE — Patient Instructions (Signed)
Nitroglycerin Protocol   Apply 1/4 nitroglycerin patch to affected area daily.  Change position of patch within the affected area every 24 hours.  You may experience a headache during the first 1-2 weeks of using the patch, these should subside.  If you experience headaches after beginning nitroglycerin patch treatment, you may take your preferred over the counter pain reliever.  Another side effect of the nitroglycerin patch is skin irritation or rash related to patch adhesive.  Please notify our office if you develop more severe headaches or rash, and stop the patch.  Tendon healing with nitroglycerin patch may require 12 to 24 weeks depending on the extent of injury.  Men should not use if taking Viagra, Cialis, or Levitra.   Do not use if you have migraines or rosacea.    Follow up in 3 months

## 2015-07-10 NOTE — Assessment & Plan Note (Signed)
This has clinically healed  Note there are irregular fragments remaining from original injury

## 2015-07-10 NOTE — Assessment & Plan Note (Signed)
Really not able to identify nerve irritation today  Suspect this is an avulsion of insertion of TFL at iliac crest and retracted muscle  Trial on NTG  Compression HEP  Reck in 3 mos

## 2015-07-10 NOTE — Progress Notes (Signed)
Patient ID: Harrison MonsGwendolyn Lyness, female   DOB: 07/19/1945, 70 y.o.   MRN: [id number]  Pleasant older F   CC:  RT anterior hip pain  HPI Patient kicked by horse 18 mos ago Fracture of RT iliac crest Conservative care and gradually healed  Since then she gets intermittent ant hip spasm Mostly with yoga and certain positions of RT hip ER Not with equestrian riding  Using compression sleeve and feels that this helps along with some of HEP  ROS No radiation of pain down leg No burning or numbnes over upper RT thigh  Pexam NAD BP 120/64 mmHg  Ht 5' 2.5" (1.588 m)  Wt 116 lb (52.617 kg)  BMI 20.87 kg/m2  Hip RT with normal ROM Lt hip nl ROM RT Pearlean BrownieFaber is decreased at 45 deg/ Lt FABER is 60 deg RT SIJ with limited motion  SLR neg Strength is good with hip flexion and abduction  Bulky mass with tightness in anterior RT hip/ exaggerated on SLR   US There is retraction of TFL on RT distally Increase in scar tissue Less hypoechoic change than noted on last exam Irregular fracture fragments noted at Lt ASIS

## 2016-07-03 ENCOUNTER — Other Ambulatory Visit: Payer: Self-pay | Admitting: Internal Medicine

## 2016-07-03 DIAGNOSIS — Z1231 Encounter for screening mammogram for malignant neoplasm of breast: Secondary | ICD-10-CM

## 2016-07-23 ENCOUNTER — Ambulatory Visit: Payer: Medicare Other

## 2016-07-31 ENCOUNTER — Ambulatory Visit: Payer: Medicare Other

## 2016-08-06 ENCOUNTER — Ambulatory Visit
Admission: RE | Admit: 2016-08-06 | Discharge: 2016-08-06 | Disposition: A | Discharge status: Home / Self Care | Payer: Medicare Other | Source: Ambulatory Visit | Attending: Internal Medicine | Admitting: Internal Medicine

## 2016-08-06 DIAGNOSIS — Z1231 Encounter for screening mammogram for malignant neoplasm of breast: Secondary | ICD-10-CM

## 2016-11-03 ENCOUNTER — Encounter: Payer: Self-pay | Admitting: Internal Medicine

## 2016-12-25 ENCOUNTER — Ambulatory Visit (AMBULATORY_SURGERY_CENTER): Payer: Self-pay | Admitting: *Deleted

## 2016-12-25 VITALS — Ht 62.0 in | Wt 116.2 lb

## 2016-12-25 DIAGNOSIS — Z1211 Encounter for screening for malignant neoplasm of colon: Secondary | ICD-10-CM

## 2016-12-25 NOTE — Progress Notes (Signed)
No egg or soy allergy known to patient  No issues with past sedation with any surgeries  or procedures, no intubation problems   Some nausea with sedation 10 years ago with last colon does not remember the name.   No diet pills per patient No home 02 use per patient  No blood thinners per patient  Pt denies issues with constipation  No A fib or A flutter  EMMI video sent to pt's e mail

## 2017-01-08 ENCOUNTER — Encounter: Payer: Medicare Other | Admitting: Internal Medicine

## 2017-02-04 ENCOUNTER — Ambulatory Visit (AMBULATORY_SURGERY_CENTER): Payer: Medicare Other | Admitting: Internal Medicine

## 2017-02-04 ENCOUNTER — Encounter: Payer: Self-pay | Admitting: Internal Medicine

## 2017-02-04 VITALS — BP 122/61 | HR 63 | Temp 98.2°F | Resp 17 | Ht 62.0 in | Wt 116.2 lb

## 2017-02-04 DIAGNOSIS — Z1212 Encounter for screening for malignant neoplasm of rectum: Secondary | ICD-10-CM | POA: Diagnosis not present

## 2017-02-04 DIAGNOSIS — Z1211 Encounter for screening for malignant neoplasm of colon: Secondary | ICD-10-CM | POA: Diagnosis not present

## 2017-02-04 NOTE — Patient Instructions (Addendum)
No polyps or cancer seen. You do have diverticulosis - thickened muscle rings and pouches in the colon wall. Please read the handout about this condition.  I do not recommend any more routine repeat colonoscopy or colon cancer screening tests for you based upon these and past results and your age.  I appreciate the opportunity to care for you. Iva Booparl E. Gessner, MD, FACG YOU HAD AN ENDOSCOPIC PROCEDURE TODAY AT THE [facility]:   Refer to the procedure report that was given to you for any specific questions about what was found during the examination.  If the procedure report does not answer your questions, please call your gastroenterologist to clarify.  If you requested that your care partner not be given the details of your procedure findings, then the procedure report has been included in a sealed envelope for you to review at your convenience later.  YOU SHOULD EXPECT: Some feelings of bloating in the abdomen. Passage of more gas than usual.  Walking can help get rid of the air that was put into your GI tract during the procedure and reduce the bloating. If you had a lower endoscopy (such as a colonoscopy or flexible sigmoidoscopy) you may notice spotting of blood in your stool or on the toilet paper. If you underwent a bowel prep for your procedure, you may not have a normal bowel movement for a few days.  Please Note:  You might notice some irritation and congestion in your nose or some drainage.  This is from the oxygen used during your procedure.  There is no need for concern and it should clear up in a day or so.  SYMPTOMS TO REPORT IMMEDIATELY:   Following lower endoscopy (colonoscopy or flexible sigmoidoscopy):  Excessive amounts of blood in the stool  Significant tenderness or worsening of abdominal pains  Swelling of the abdomen that is new, acute  Fever of 100F or higher  For urgent or emergent issues, a gastroenterologist can be reached at any hour by  calling [phone].   DIET:  We do recommend a small meal at first, but then you may proceed to your regular diet.  Drink plenty of fluids but you should avoid alcoholic beverages for 24 hours.  MEDICATIONS: Continue present medications.  ACTIVITY:  You should plan to take it easy for the rest of today and you should NOT DRIVE or use heavy machinery until tomorrow (because of the sedation medicines used during the test).    FOLLOW UP: Our staff will call the number listed on your records the next business day following your procedure to check on you and address any questions or concerns that you may have regarding the information given to you following your procedure. If we do not reach you, we will leave a message.  However, if you are feeling well and you are not experiencing any problems, there is no need to return our call.  We will assume that you have returned to your regular daily activities without incident.  If any biopsies were taken you will be contacted by phone or by letter within the next 1-3 weeks.  Please call us at [phone] if you have not heard about the biopsies in 3 weeks.   Thank you for allowing us to provide for your healthcare needs today.   SIGNATURES/CONFIDENTIALITY: You and/or your care partner have signed paperwork which will be entered into your electronic medical record.  These signatures attest to the fact that that the information  above on your After Visit Summary has been reviewed and is understood.  Full responsibility of the confidentiality of this discharge information lies with you and/or your care-partner. 

## 2017-02-04 NOTE — Op Note (Signed)
[facility] Patient Name: [name] French Procedure Date: 02/04/2017 9:26 AM MRN: [id number] Endoscopist: Iva Booparl E Mandy Fitzwater , MD Age: [age] Referring MD:  Date of Birth: 1945/07/01 Gender: Female Account #: [id number] Procedure:                Colonoscopy Indications:              Screening for colorectal malignant neoplasm Medicines:                Propofol per Anesthesia, Monitored Anesthesia Care Procedure:                Pre-Anesthesia Assessment:                           - Prior to the procedure, a History and Physical                            was performed, and patient medications and                            allergies were reviewed. The patient's tolerance of                            previous anesthesia was also reviewed. The risks                            and benefits of the procedure and the sedation                            options and risks were discussed with the patient.                            All questions were answered, and informed consent                            was obtained. Prior Anticoagulants: The patient has                            taken no previous anticoagulant or antiplatelet                            agents. ASA Grade Assessment: II - A patient with                            mild systemic disease. After reviewing the risks                            and benefits, the patient was deemed in                            satisfactory condition to undergo the procedure.                           After obtaining informed consent, the colonoscope  was passed under direct vision. Throughout the                            procedure, the patient's blood pressure, pulse, and                            oxygen saturations were monitored continuously. The                            Model [id number]-[id number]) scope was introduced                            through the anus and advanced to the the cecum,               identified by appendiceal orifice and ileocecal                            valve. The colonoscopy was somewhat difficult due                            to restricted mobility of the colon. Successful                            completion of the procedure was aided by lavage.                            The patient tolerated the procedure well. The                            quality of the bowel preparation was excellent. The                            bowel preparation used was Miralax. The ileocecal                            valve, appendiceal orifice, and rectum were                            photographed. Scope In: 9:28:47 AM Scope Out: 9:45:07 AM Scope Withdrawal Time: 0 hours 10 minutes 21 seconds  Total Procedure Duration: 0 hours 16 minutes 20 seconds  Findings:                 The perianal and digital rectal examinations were                            normal.                           Diverticula were found in the entire colon.                           The exam was otherwise without abnormality on  direct and retroflexion views. Complications:            No immediate complications. Estimated Blood Loss:     Estimated blood loss: none. Impression:               - Diverticulosis in the entire examined colon.                           - The examination was otherwise normal on direct                            and retroflexion views.                           - No specimens collected. Recommendation:           - Patient has a contact number available for                            emergencies. The signs and symptoms of potential                            delayed complications were discussed with the                            patient. Return to normal activities tomorrow.                            Written discharge instructions were provided to the                            patient.                           - Resume previous diet.                            - Continue present medications.                           - No repeat colonoscopy due to age and the absence                            of colonic polyps. Iva Booparl E Noma Quijas, MD 02/04/2017 9:52:59 AM This report has been signed electronically.

## 2017-02-04 NOTE — Progress Notes (Signed)
Pt's states no medical or surgical changes since previsit or office visit. 

## 2017-02-04 NOTE — Progress Notes (Signed)
A/ox3 pleased with MAC, report to Sarah RN 

## 2017-02-05 ENCOUNTER — Telehealth: Payer: Self-pay | Admitting: *Deleted

## 2017-02-05 ENCOUNTER — Telehealth: Payer: Self-pay

## 2017-02-05 NOTE — Telephone Encounter (Signed)
  Follow up Call-  Call back number 02/04/2017  Post procedure Call Back phone  # [phone]  Permission to leave phone message Yes  Some recent data might be hidden    [facility]

## 2017-02-05 NOTE — Telephone Encounter (Signed)
Follow up call x 2. Name identifier, left a message x 2.

## 2017-02-05 NOTE — Telephone Encounter (Signed)
Patient returned call and said she is doing great!

## 2017-08-05 ENCOUNTER — Other Ambulatory Visit: Payer: Self-pay | Admitting: Internal Medicine

## 2017-08-05 DIAGNOSIS — Z1231 Encounter for screening mammogram for malignant neoplasm of breast: Secondary | ICD-10-CM

## 2017-08-27 ENCOUNTER — Ambulatory Visit
Admission: RE | Admit: 2017-08-27 | Discharge: 2017-08-27 | Disposition: A | Discharge status: Home / Self Care | Payer: Medicare Other | Source: Ambulatory Visit | Attending: Internal Medicine | Admitting: Internal Medicine

## 2017-08-27 DIAGNOSIS — Z1231 Encounter for screening mammogram for malignant neoplasm of breast: Secondary | ICD-10-CM

## 2018-09-17 ENCOUNTER — Other Ambulatory Visit: Payer: Self-pay | Admitting: Internal Medicine

## 2018-09-17 DIAGNOSIS — Z1231 Encounter for screening mammogram for malignant neoplasm of breast: Secondary | ICD-10-CM

## 2018-11-02 ENCOUNTER — Ambulatory Visit
Admission: RE | Admit: 2018-11-02 | Discharge: 2018-11-02 | Disposition: A | Discharge status: Home / Self Care | Payer: Medicare Other | Source: Ambulatory Visit | Attending: Internal Medicine | Admitting: Internal Medicine

## 2018-11-02 ENCOUNTER — Other Ambulatory Visit: Payer: Self-pay

## 2018-11-02 DIAGNOSIS — Z1231 Encounter for screening mammogram for malignant neoplasm of breast: Secondary | ICD-10-CM

## 2019-04-24 ENCOUNTER — Ambulatory Visit: Payer: MEDICARE

## 2019-05-05 ENCOUNTER — Ambulatory Visit: Payer: MEDICARE

## 2019-05-06 ENCOUNTER — Ambulatory Visit: Payer: Medicare Other | Attending: Internal Medicine

## 2019-05-06 DIAGNOSIS — Z23 Encounter for immunization: Secondary | ICD-10-CM

## 2019-05-06 NOTE — Progress Notes (Signed)
   Covid-19 Vaccination Clinic  Name:  Samirah Scarpati    MRN: [id number] DOB: 08-16-1945  05/06/2019  Ms. Rusconi was observed post Covid-19 immunization for 15 minutes without incidence. She was provided with Vaccine Information Sheet and instruction to access the V-Safe system.   Ms. Genson was instructed to call 911 with any severe reactions post vaccine: Marland Kitchen Difficulty breathing  . Swelling of your face and throat  . A fast heartbeat  . A bad rash all over your body  . Dizziness and weakness    Immunizations Administered    Name Date Dose VIS Date Route   Pfizer COVID-19 Vaccine 05/06/2019  2:06 PM 0.3 mL 03/04/2019 Intramuscular   Manufacturer: [employer], [employer]   Lot: [id number]   NDC: [address]-0

## 2019-05-31 ENCOUNTER — Ambulatory Visit: Payer: Medicare Other | Attending: Internal Medicine

## 2019-05-31 DIAGNOSIS — Z23 Encounter for immunization: Secondary | ICD-10-CM | POA: Insufficient documentation

## 2019-05-31 NOTE — Progress Notes (Signed)
   Covid-19 Vaccination Clinic  Name:  [name] French    MRN: [id number] DOB: Apr 30, 1945  05/31/2019  [name] was observed post Covid-19 immunization for 15 minutes without incident. She was provided with Vaccine Information Sheet and instruction to access the V-Safe system.   [name] was instructed to call 911 with any severe reactions post vaccine: Marland Kitchen Difficulty breathing  . Swelling of face and throat  . A fast heartbeat  . A bad rash all over body  . Dizziness and weakness   Immunizations Administered    Name Date Dose VIS Date Route   Pfizer COVID-19 Vaccine 05/31/2019  3:06 PM 0.3 mL 03/04/2019 Intramuscular   Manufacturer: [employer], [employer]   Lot: [id number]   NDC: [address]-6

## 2019-10-11 ENCOUNTER — Other Ambulatory Visit: Payer: Self-pay | Admitting: Internal Medicine

## 2019-10-11 DIAGNOSIS — Z1231 Encounter for screening mammogram for malignant neoplasm of breast: Secondary | ICD-10-CM

## 2019-11-03 ENCOUNTER — Ambulatory Visit: Payer: MEDICARE

## 2019-11-08 ENCOUNTER — Ambulatory Visit
Admission: RE | Admit: 2019-11-08 | Discharge: 2019-11-08 | Disposition: A | Discharge status: Home / Self Care | Payer: Medicare Other | Source: Ambulatory Visit | Attending: Internal Medicine | Admitting: Internal Medicine

## 2019-11-08 ENCOUNTER — Other Ambulatory Visit: Payer: Self-pay

## 2019-11-08 DIAGNOSIS — Z1231 Encounter for screening mammogram for malignant neoplasm of breast: Secondary | ICD-10-CM

## 2020-02-04 ENCOUNTER — Other Ambulatory Visit: Payer: Self-pay

## 2020-02-04 ENCOUNTER — Ambulatory Visit: Payer: Medicare Other | Attending: Internal Medicine

## 2020-02-04 DIAGNOSIS — Z23 Encounter for immunization: Secondary | ICD-10-CM

## 2020-02-04 NOTE — Progress Notes (Signed)
   Covid-19 Vaccination Clinic  Name:  [name] French    MRN: [id number] DOB: 1945-12-13  02/04/2020  Ms. [name] was observed post Covid-19 immunization for 15 minutes without incident. She was provided with Vaccine Information Sheet and instruction to access the V-Safe system.   Ms. [name] was instructed to call 911 with any severe reactions post vaccine: Marland Kitchen Difficulty breathing  . Swelling of face and throat  . A fast heartbeat  . A bad rash all over body  . Dizziness and weakness   Immunizations Administered    Name Date Dose VIS Date Route   Pfizer COVID-19 Vaccine 02/04/2020 11:34 AM 0.3 mL 01/11/2020 Intramuscular   Manufacturer: [employer], [employer]   Lot: [id number]   NDC: [address]-1

## 2020-07-24 ENCOUNTER — Ambulatory Visit
Admission: EM | Admit: 2020-07-24 | Discharge: 2020-07-24 | Disposition: A | Discharge status: Home / Self Care | Payer: Medicare Other

## 2020-07-24 ENCOUNTER — Other Ambulatory Visit: Payer: Self-pay

## 2020-07-24 NOTE — ED Notes (Signed)
Pt called in the lobby no answered.   Called in the mobile, pt reports she went to another urgent care.

## 2020-10-04 ENCOUNTER — Other Ambulatory Visit: Payer: Self-pay | Admitting: Internal Medicine

## 2020-10-04 DIAGNOSIS — Z1231 Encounter for screening mammogram for malignant neoplasm of breast: Secondary | ICD-10-CM

## 2020-11-28 ENCOUNTER — Other Ambulatory Visit: Payer: Self-pay

## 2020-11-28 ENCOUNTER — Ambulatory Visit
Admission: RE | Admit: 2020-11-28 | Discharge: 2020-11-28 | Disposition: A | Discharge status: Home / Self Care | Payer: Medicare Other | Source: Ambulatory Visit | Attending: Internal Medicine | Admitting: Internal Medicine

## 2020-11-28 DIAGNOSIS — Z1231 Encounter for screening mammogram for malignant neoplasm of breast: Secondary | ICD-10-CM

## 2021-04-15 ENCOUNTER — Other Ambulatory Visit: Payer: Self-pay | Admitting: Internal Medicine

## 2021-04-15 DIAGNOSIS — Z1231 Encounter for screening mammogram for malignant neoplasm of breast: Secondary | ICD-10-CM

## 2021-09-09 ENCOUNTER — Other Ambulatory Visit: Payer: Self-pay | Admitting: Internal Medicine

## 2021-09-09 DIAGNOSIS — M8580 Other specified disorders of bone density and structure, unspecified site: Secondary | ICD-10-CM

## 2021-09-13 ENCOUNTER — Ambulatory Visit
Admission: RE | Admit: 2021-09-13 | Discharge: 2021-09-13 | Disposition: A | Discharge status: Home / Self Care | Payer: Medicare Other | Source: Ambulatory Visit | Attending: Internal Medicine | Admitting: Internal Medicine

## 2021-09-13 DIAGNOSIS — M8580 Other specified disorders of bone density and structure, unspecified site: Secondary | ICD-10-CM

## 2021-11-27 ENCOUNTER — Other Ambulatory Visit: Payer: Self-pay | Admitting: Internal Medicine

## 2021-11-27 DIAGNOSIS — Z1231 Encounter for screening mammogram for malignant neoplasm of breast: Secondary | ICD-10-CM

## 2021-12-17 ENCOUNTER — Ambulatory Visit: Payer: Medicare Other

## 2022-01-20 ENCOUNTER — Ambulatory Visit: Payer: Medicare Other

## 2022-02-09 IMAGING — MG MM DIGITAL SCREENING BILAT W/ TOMO AND CAD
8 series · 9 of 24 positions shown · non-contrast
Comparison: Previous exam(s).

CLINICAL DATA: Screening.

EXAM:
DIGITAL SCREENING BILATERAL MAMMOGRAM WITH TOMOSYNTHESIS AND CAD
TECHNIQUE: Bilateral screening digital craniocaudal and mediolateral oblique
mammograms were obtained. Bilateral screening digital breast
tomosynthesis was performed. The images were evaluated with
computer-aided detection.

[L CC synth-2D]
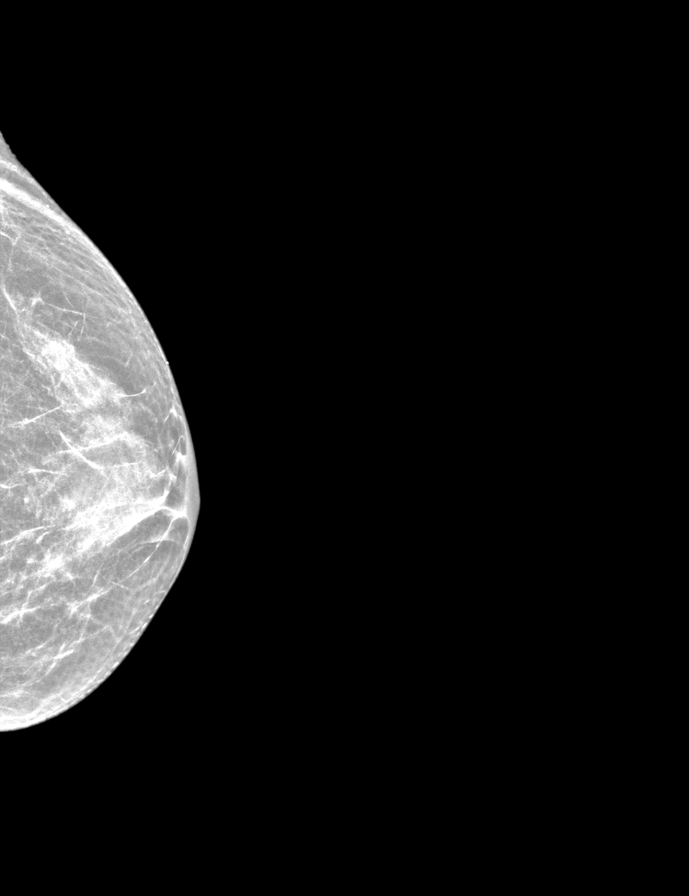

[R MLO synth-2D]
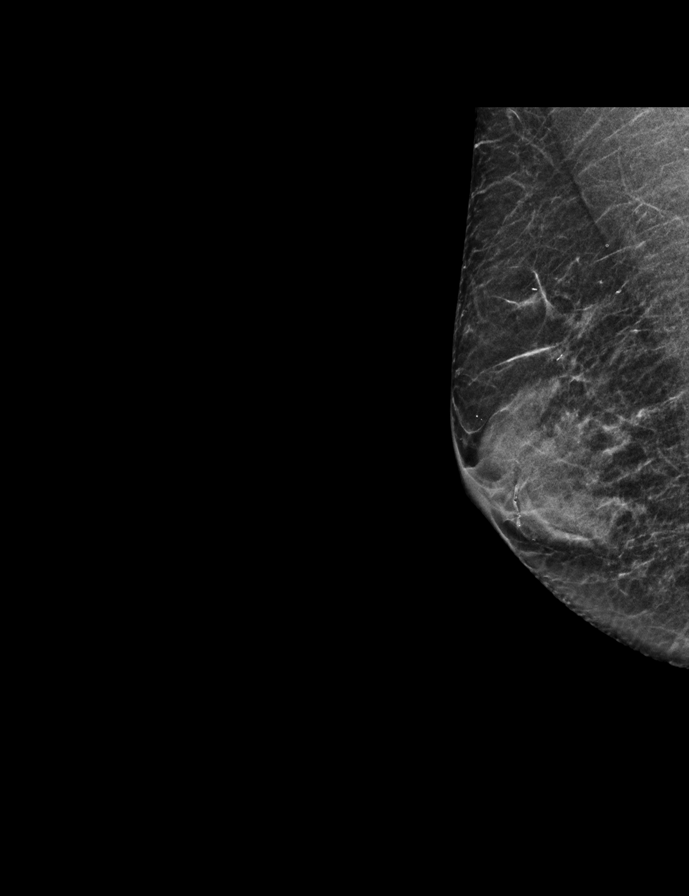

[L MLO synth-2D]
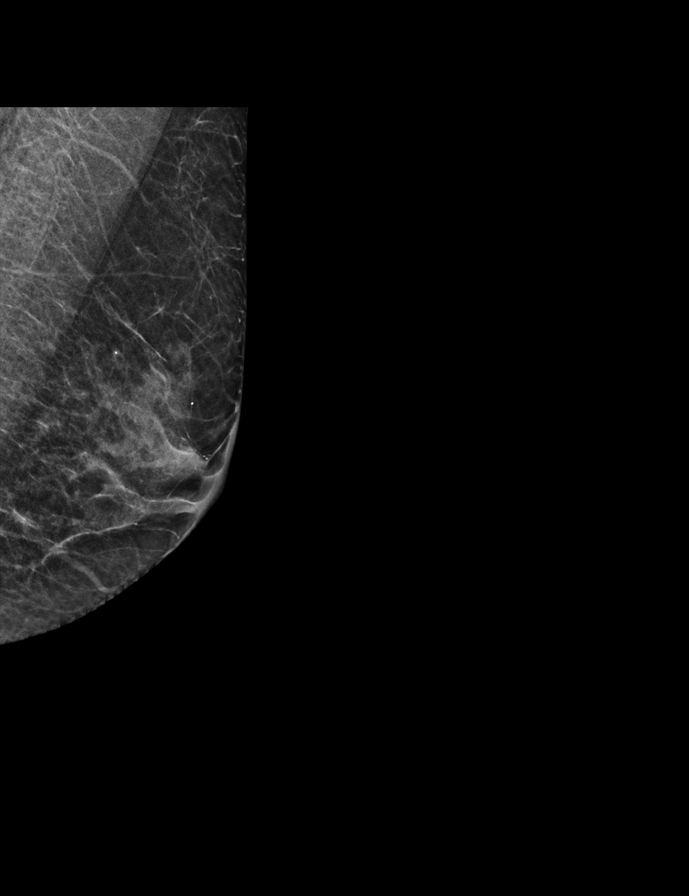

[R CC synth-2D]
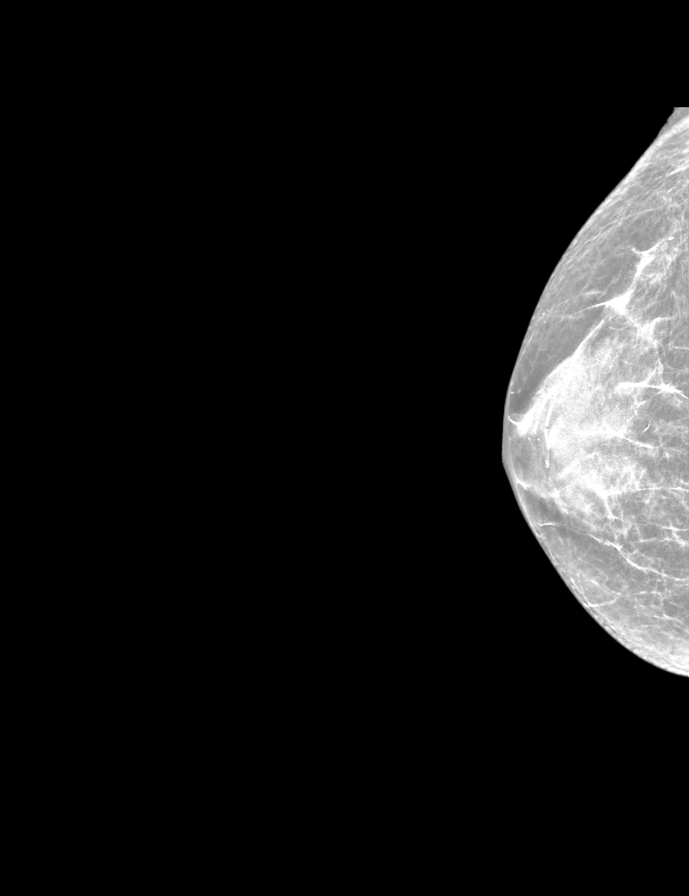

[L MLO tomo · 2 of 41 frames shown]
[frame 14/41]
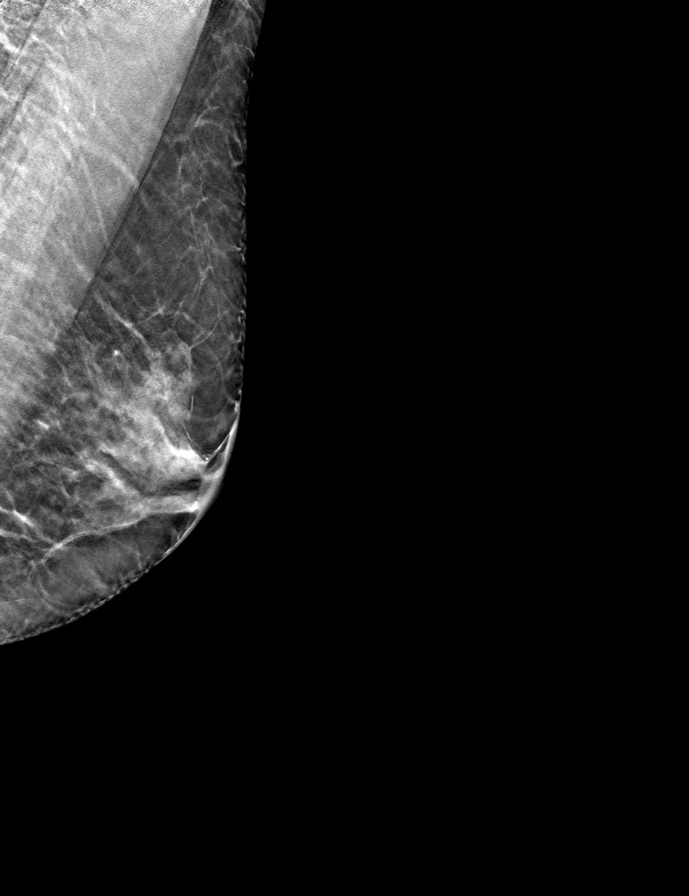
[frame 21/41]
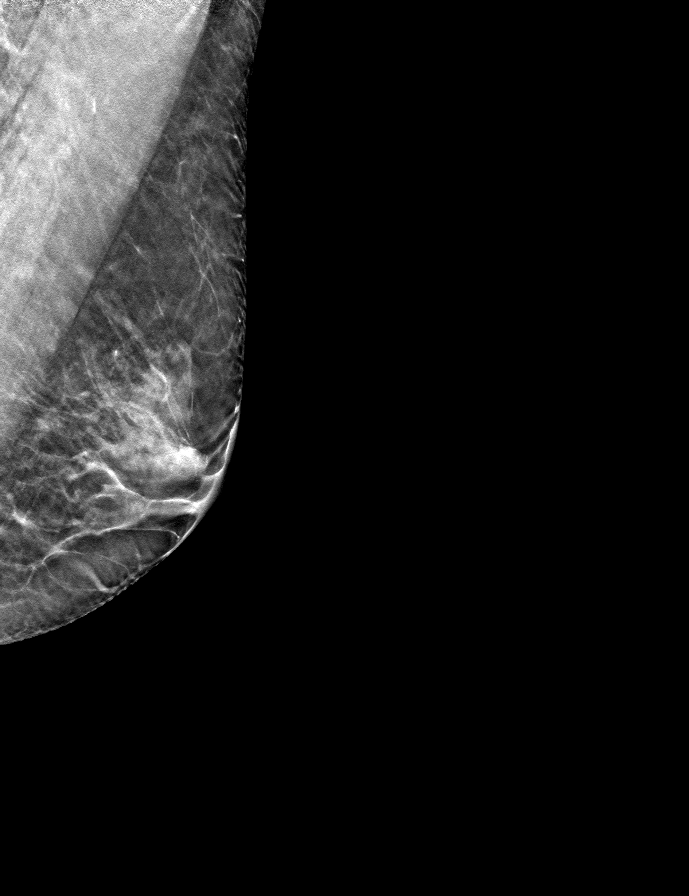

[L CC tomo · tomo slice 23/44.0]
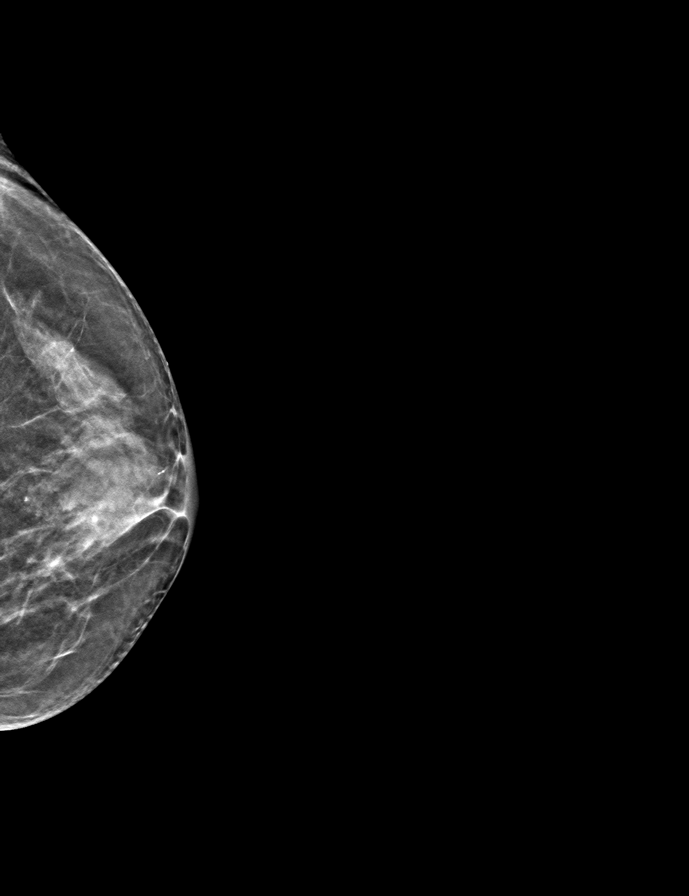

[R MLO tomo · tomo slice 20/39.0]
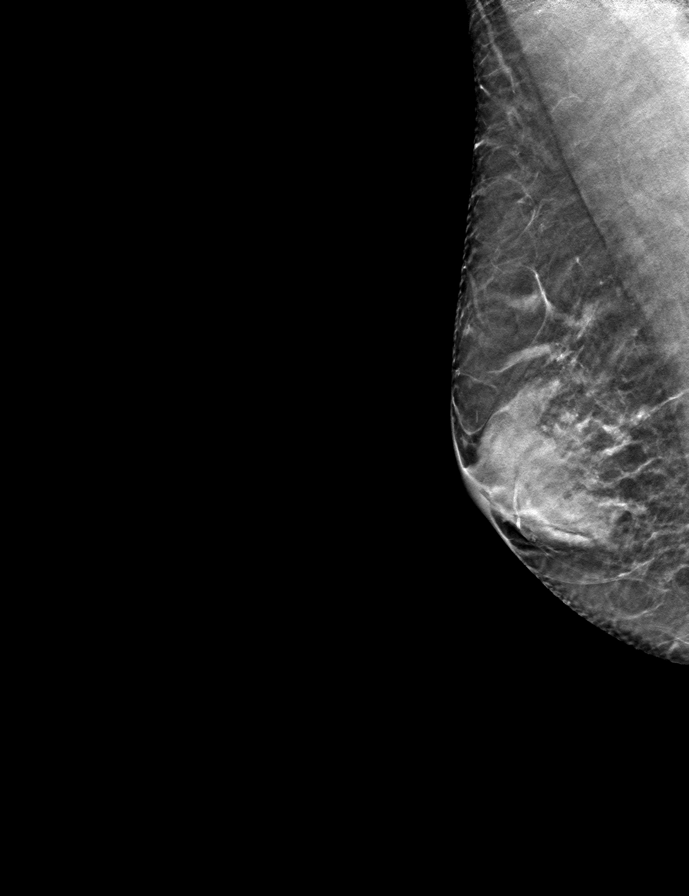

[R CC tomo · tomo slice 23/44.0]
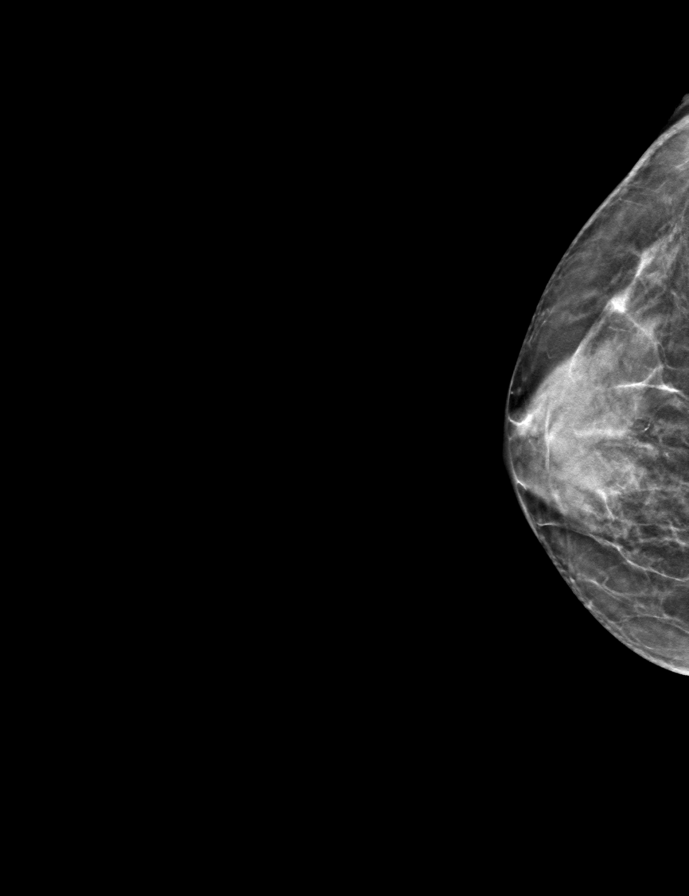

[9 of 24 positions shown; findings below may reference images not displayed]

ACR Breast Density Category c: The breast tissue is heterogeneously
dense, which may obscure small masses.
FINDINGS: There are no findings suspicious for malignancy.
IMPRESSION: No mammographic evidence of malignancy. A result letter of this
screening mammogram will be mailed directly to the patient.

RECOMMENDATION:
Screening mammogram in one year. (Code:Q3-W-BC3)

BI-RADS CATEGORY  1: Negative.

## 2022-02-28 ENCOUNTER — Ambulatory Visit
Admission: RE | Admit: 2022-02-28 | Discharge: 2022-02-28 | Disposition: A | Discharge status: Home / Self Care | Payer: Medicare Other | Source: Ambulatory Visit | Attending: Internal Medicine | Admitting: Internal Medicine

## 2022-02-28 DIAGNOSIS — Z1231 Encounter for screening mammogram for malignant neoplasm of breast: Secondary | ICD-10-CM

## 2023-03-30 ENCOUNTER — Other Ambulatory Visit: Payer: Self-pay | Admitting: Internal Medicine

## 2023-03-30 DIAGNOSIS — Z1231 Encounter for screening mammogram for malignant neoplasm of breast: Secondary | ICD-10-CM

## 2023-04-16 ENCOUNTER — Ambulatory Visit
Admission: RE | Admit: 2023-04-16 | Discharge: 2023-04-16 | Disposition: A | Discharge status: Home / Self Care | Payer: Medicare Other | Source: Ambulatory Visit | Attending: Internal Medicine | Admitting: Internal Medicine

## 2023-04-16 DIAGNOSIS — Z1231 Encounter for screening mammogram for malignant neoplasm of breast: Secondary | ICD-10-CM
